# Patient Record
Sex: Female | Born: 1975 | Race: Black or African American | Hispanic: No | Smoking: Former smoker
Health system: Southern US, Community
[De-identification: ages and names within clinical notes are randomized; demographics above are authoritative.]

## PROBLEM LIST (undated history)

## (undated) DIAGNOSIS — T7840XA Allergy, unspecified, initial encounter: Secondary | ICD-10-CM

## (undated) DIAGNOSIS — E119 Type 2 diabetes mellitus without complications: Secondary | ICD-10-CM

## (undated) DIAGNOSIS — K219 Gastro-esophageal reflux disease without esophagitis: Secondary | ICD-10-CM

## (undated) DIAGNOSIS — F419 Anxiety disorder, unspecified: Secondary | ICD-10-CM

## (undated) DIAGNOSIS — F329 Major depressive disorder, single episode, unspecified: Secondary | ICD-10-CM

## (undated) DIAGNOSIS — F32A Depression, unspecified: Secondary | ICD-10-CM

## (undated) DIAGNOSIS — I10 Essential (primary) hypertension: Secondary | ICD-10-CM

## (undated) DIAGNOSIS — D649 Anemia, unspecified: Secondary | ICD-10-CM

## (undated) HISTORY — DX: Allergy, unspecified, initial encounter: T78.40XA

## (undated) HISTORY — DX: Major depressive disorder, single episode, unspecified: F32.9

## (undated) HISTORY — PX: CHOLECYSTECTOMY: SHX55

## (undated) HISTORY — DX: Depression, unspecified: F32.A

## (undated) HISTORY — DX: Anemia, unspecified: D64.9

## (undated) HISTORY — DX: Anxiety disorder, unspecified: F41.9

## (undated) HISTORY — PX: TUBAL LIGATION: SHX77

## (undated) HISTORY — DX: Gastro-esophageal reflux disease without esophagitis: K21.9

---

## 1998-07-18 ENCOUNTER — Emergency Department (HOSPITAL_COMMUNITY): Admission: EM | Admit: 1998-07-18 | Discharge: 1998-07-18 | Payer: Self-pay | Admitting: Emergency Medicine

## 1999-06-03 ENCOUNTER — Emergency Department (HOSPITAL_COMMUNITY): Admission: EM | Admit: 1999-06-03 | Discharge: 1999-06-03 | Payer: Self-pay | Admitting: Emergency Medicine

## 1999-12-25 ENCOUNTER — Emergency Department (HOSPITAL_COMMUNITY): Admission: EM | Admit: 1999-12-25 | Discharge: 1999-12-25 | Payer: Self-pay | Admitting: Emergency Medicine

## 1999-12-25 ENCOUNTER — Encounter: Payer: Self-pay | Admitting: Emergency Medicine

## 2000-05-20 ENCOUNTER — Inpatient Hospital Stay (HOSPITAL_COMMUNITY): Admission: AD | Admit: 2000-05-20 | Discharge: 2000-05-20 | Payer: Self-pay | Admitting: *Deleted

## 2000-05-20 ENCOUNTER — Encounter: Payer: Self-pay | Admitting: *Deleted

## 2000-05-20 ENCOUNTER — Inpatient Hospital Stay (HOSPITAL_COMMUNITY): Admission: AD | Admit: 2000-05-20 | Discharge: 2000-05-20 | Payer: Self-pay | Admitting: Obstetrics

## 2000-05-22 ENCOUNTER — Inpatient Hospital Stay (HOSPITAL_COMMUNITY): Admission: AD | Admit: 2000-05-22 | Discharge: 2000-05-22 | Payer: Self-pay | Admitting: *Deleted

## 2000-05-23 ENCOUNTER — Inpatient Hospital Stay (HOSPITAL_COMMUNITY): Admission: AD | Admit: 2000-05-23 | Discharge: 2000-05-23 | Payer: Self-pay | Admitting: Obstetrics

## 2000-05-23 ENCOUNTER — Encounter: Payer: Self-pay | Admitting: Obstetrics

## 2000-08-11 ENCOUNTER — Encounter: Payer: Self-pay | Admitting: *Deleted

## 2000-08-11 ENCOUNTER — Emergency Department (HOSPITAL_COMMUNITY): Admission: EM | Admit: 2000-08-11 | Discharge: 2000-08-11 | Payer: Self-pay | Admitting: Emergency Medicine

## 2001-03-22 ENCOUNTER — Ambulatory Visit (HOSPITAL_COMMUNITY): Admission: RE | Admit: 2001-03-22 | Discharge: 2001-03-22 | Payer: Self-pay | Admitting: Obstetrics

## 2001-05-31 ENCOUNTER — Inpatient Hospital Stay (HOSPITAL_COMMUNITY): Admission: RE | Admit: 2001-05-31 | Discharge: 2001-05-31 | Payer: Self-pay | Admitting: *Deleted

## 2001-08-24 ENCOUNTER — Inpatient Hospital Stay (HOSPITAL_COMMUNITY): Admission: AD | Admit: 2001-08-24 | Discharge: 2001-08-26 | Payer: Self-pay | Admitting: *Deleted

## 2002-02-25 ENCOUNTER — Emergency Department (HOSPITAL_COMMUNITY): Admission: EM | Admit: 2002-02-25 | Discharge: 2002-02-25 | Payer: Self-pay | Admitting: Emergency Medicine

## 2002-02-25 ENCOUNTER — Encounter: Payer: Self-pay | Admitting: Emergency Medicine

## 2002-05-09 ENCOUNTER — Other Ambulatory Visit: Admission: RE | Admit: 2002-05-09 | Discharge: 2002-05-09 | Payer: Self-pay | Admitting: *Deleted

## 2002-05-10 ENCOUNTER — Encounter: Payer: Self-pay | Admitting: *Deleted

## 2002-05-10 ENCOUNTER — Ambulatory Visit (HOSPITAL_COMMUNITY): Admission: RE | Admit: 2002-05-10 | Discharge: 2002-05-10 | Payer: Self-pay | Admitting: *Deleted

## 2002-10-30 ENCOUNTER — Inpatient Hospital Stay (HOSPITAL_COMMUNITY): Admission: AD | Admit: 2002-10-30 | Discharge: 2002-11-01 | Payer: Self-pay | Admitting: *Deleted

## 2003-01-11 ENCOUNTER — Inpatient Hospital Stay (HOSPITAL_COMMUNITY): Admission: AD | Admit: 2003-01-11 | Discharge: 2003-01-11 | Payer: Self-pay | Admitting: Obstetrics and Gynecology

## 2004-01-10 ENCOUNTER — Other Ambulatory Visit: Admission: RE | Admit: 2004-01-10 | Discharge: 2004-01-10 | Payer: Self-pay | Admitting: Obstetrics and Gynecology

## 2004-10-19 ENCOUNTER — Emergency Department (HOSPITAL_COMMUNITY): Admission: EM | Admit: 2004-10-19 | Discharge: 2004-10-19 | Payer: Self-pay | Admitting: Family Medicine

## 2004-12-23 ENCOUNTER — Emergency Department (HOSPITAL_COMMUNITY): Admission: EM | Admit: 2004-12-23 | Discharge: 2004-12-23 | Payer: Self-pay | Admitting: Emergency Medicine

## 2005-03-16 ENCOUNTER — Emergency Department (HOSPITAL_COMMUNITY): Admission: EM | Admit: 2005-03-16 | Discharge: 2005-03-16 | Payer: Self-pay | Admitting: Emergency Medicine

## 2006-04-24 ENCOUNTER — Emergency Department (HOSPITAL_COMMUNITY): Admission: EM | Admit: 2006-04-24 | Discharge: 2006-04-24 | Payer: Self-pay | Admitting: Family Medicine

## 2006-07-08 ENCOUNTER — Emergency Department (HOSPITAL_COMMUNITY): Admission: EM | Admit: 2006-07-08 | Discharge: 2006-07-08 | Payer: Self-pay | Admitting: Family Medicine

## 2006-07-20 ENCOUNTER — Other Ambulatory Visit: Admission: RE | Admit: 2006-07-20 | Discharge: 2006-07-20 | Payer: Self-pay | Admitting: Obstetrics and Gynecology

## 2006-09-07 ENCOUNTER — Emergency Department (HOSPITAL_COMMUNITY): Admission: EM | Admit: 2006-09-07 | Discharge: 2006-09-07 | Payer: Self-pay | Admitting: Emergency Medicine

## 2006-11-16 ENCOUNTER — Ambulatory Visit: Payer: Self-pay | Admitting: Obstetrics & Gynecology

## 2006-11-16 ENCOUNTER — Inpatient Hospital Stay (HOSPITAL_COMMUNITY): Admission: AD | Admit: 2006-11-16 | Discharge: 2006-11-16 | Payer: Self-pay | Admitting: Obstetrics and Gynecology

## 2007-01-30 ENCOUNTER — Inpatient Hospital Stay (HOSPITAL_COMMUNITY): Admission: AD | Admit: 2007-01-30 | Discharge: 2007-01-30 | Payer: Self-pay | Admitting: Obstetrics & Gynecology

## 2007-02-03 ENCOUNTER — Encounter (INDEPENDENT_AMBULATORY_CARE_PROVIDER_SITE_OTHER): Payer: Self-pay | Admitting: Obstetrics and Gynecology

## 2007-02-03 ENCOUNTER — Ambulatory Visit (HOSPITAL_COMMUNITY): Admission: AD | Admit: 2007-02-03 | Discharge: 2007-02-03 | Payer: Self-pay | Admitting: Obstetrics and Gynecology

## 2010-07-23 NOTE — Op Note (Signed)
NAME:  Kylie Robbins, Kylie Robbins NO.:  [id number]   MEDICAL RECORD NO.:  [id number]          PATIENT TYPE:  MAT   LOCATION:  MATC                          FACILITY:  [facility]   PHYSICIAN:  Huel Cote, M.D. DATE OF BIRTH:  12-31-1975   DATE OF PROCEDURE:  DATE OF DISCHARGE:                               OPERATIVE REPORT   PREOPERATIVE DIAGNOSIS:  Incomplete abortion at [redacted] weeks gestational age  with heavy bleeding.   POSTOPERATIVE DIAGNOSIS:  Incomplete abortion at [redacted] weeks gestational  age with heavy bleeding.   PROCEDURE:  Dilation and evacuation.   SURGEON:  Dr. Huel Cote.   ANESTHESIA:  Spinal.   FINDINGS:  The uterus was 10 weeks' size with moderate products of  conception obtained.  There were some tissue extruding through the  cervix.  This was sent to pathology.  Estimated blood loss 200 mL.  Straight cath 50 mL prior to procedure.  IV fluids 2100 mL LR.   PROCEDURE:  The patient was taken to operating room where spinal  anesthesia was obtained without difficulty.  She was then prepped and  draped in the normal sterile fashion in dorsal lithotomy position.  A  speculum was placed in the vagina after several large blood clots were  removed from the vagina and the cervix identified and noted to have some  products of conception extruding through it.  These were removed with  ring forceps and handed off to pathology.  The cervix was also noted to  be approximate 1 cm to 2 cm dilated and at this point a uterine sound  was introduced with the uterus sounding to approximately 9 cm to 10 mm.  The 9 mm suction curette was therefore obtained and easily introduced  into the uterine fundus.  In several passes large to moderate amount of  products of conception were obtained and this was continued until there  was no further tissue forthcoming.  The suction was then discontinued  and a sharp curettage performed in all quadrants.  Small amount of  tissue was  noted, however, no other large amounts of tissue were  identified.  Suction curette was then introduced 2 more times and no  significant tissue was removed.  At this point this was discontinued and  the uterus and cervix inspected.  There was really minimal bleeding  noted.  The tenaculum was removed from the anterior lip and the  tenaculum site treated with silver nitrate for no bleeding.  Sponge, lap  and needle counts were correct x2 and the patient was taken to the  recovery room in stable condition.  Given the amount of bleeding and  drop in her hemoglobin over the past several days she will have  hemoglobin checked in the recovery room and if she remains asymptomatic  with ambulation will be allowed to discharge home      Huel Cote, M.D.  Electronically Signed     KR/MEDQ  D:  02/03/2007  T:  02/04/2007  Job:  [id number]

## 2010-12-17 LAB — POCT PREGNANCY, URINE
Operator id: 28886
Preg Test, Ur: POSITIVE

## 2010-12-17 LAB — CBC
Hemoglobin: 12.5
MCHC: 34.1
MCV: 88.1
Platelets: 251
Platelets: 291

## 2010-12-17 LAB — WET PREP, GENITAL
Trich, Wet Prep: NONE SEEN
Yeast Wet Prep HPF POC: NONE SEEN

## 2010-12-17 LAB — URINE MICROSCOPIC-ADD ON

## 2010-12-17 LAB — GC/CHLAMYDIA PROBE AMP, GENITAL: Chlamydia, DNA Probe: NEGATIVE

## 2010-12-17 LAB — URINALYSIS, ROUTINE W REFLEX MICROSCOPIC
Bilirubin Urine: NEGATIVE
Glucose, UA: NEGATIVE
Leukocytes, UA: NEGATIVE
Specific Gravity, Urine: >1.03 — ABNORMAL HIGH

## 2010-12-17 LAB — ABO/RH: ABO/RH(D): A POS

## 2010-12-17 LAB — HCG, QUANTITATIVE, PREGNANCY: hCG, Beta Chain, Quant, S: 5621 — ABNORMAL HIGH

## 2010-12-17 LAB — HEMOGLOBIN AND HEMATOCRIT, BLOOD
HCT: 23.5 — ABNORMAL LOW
Hemoglobin: 8 — ABNORMAL LOW

## 2010-12-20 LAB — WET PREP, GENITAL
Trich, Wet Prep: NONE SEEN
Yeast Wet Prep HPF POC: NONE SEEN

## 2010-12-20 LAB — CBC
MCHC: 34
RBC: 4.41
WBC: 6.2

## 2010-12-20 LAB — URINALYSIS, ROUTINE W REFLEX MICROSCOPIC: Specific Gravity, Urine: 1.02

## 2010-12-20 LAB — URINE MICROSCOPIC-ADD ON

## 2010-12-20 LAB — GC/CHLAMYDIA PROBE AMP, GENITAL: Chlamydia, DNA Probe: NEGATIVE

## 2010-12-20 LAB — POCT PREGNANCY, URINE
Operator id: 220991
Preg Test, Ur: NEGATIVE

## 2015-04-30 ENCOUNTER — Emergency Department (HOSPITAL_COMMUNITY)
Admission: EM | Admit: 2015-04-30 | Discharge: 2015-04-30 | Disposition: A | Discharge status: Home / Self Care | Payer: Self-pay | Attending: Emergency Medicine | Admitting: Emergency Medicine

## 2015-04-30 ENCOUNTER — Encounter (HOSPITAL_COMMUNITY): Payer: Self-pay | Admitting: Emergency Medicine

## 2015-04-30 DIAGNOSIS — H6692 Otitis media, unspecified, left ear: Secondary | ICD-10-CM | POA: Insufficient documentation

## 2015-04-30 DIAGNOSIS — J040 Acute laryngitis: Secondary | ICD-10-CM | POA: Insufficient documentation

## 2015-04-30 MED ORDER — AMOXICILLIN-POT CLAVULANATE 875-125 MG PO TABS: 1.0000 | ORAL_TABLET | Freq: Two times a day (BID) | ORAL | Status: DC

## 2015-04-30 NOTE — ED Provider Notes (Signed)
CSN: [id number]     Arrival date & time 04/30/15  1235 History  By signing my name below, I, Kylie Robbins, attest that this documentation has been prepared under the direction and in the presence of [facility], PA-C. Electronically Signed: Ronney Robbins, ED Scribe. 04/30/2015. 4:42 PM.    Chief Complaint  Patient presents with  . Cough   The history is provided by the patient. No language interpreter was used.    HPI Comments: Kylie Robbins is a 40 y.o. female with no pertinent PMHx, who presents to the Emergency Department complaining of a gradual-onset, constant, moderate, dry cough that began 2 days ago. She also notes associated sinus pressure, bilateral ear pressure, subjective fever and chills (resolved), and voice hoarseness. She denies any excessive yelling. She denies any chest pain.    History reviewed. No pertinent past medical history. Past Surgical History  Procedure Laterality Date  . Cholecystectomy     No family history on file. Social History  Substance Use Topics  . Smoking status: Never Smoker   . Smokeless tobacco: None  . Alcohol Use: No   OB History    No data available     Review of Systems  Constitutional: Positive for fever (subjective, resolved) and chills (resolved).  HENT: Positive for congestion, ear pain (pressure), sinus pressure and voice change (voice hoarseness).   Respiratory: Positive for cough.   Cardiovascular: Negative for chest pain.    Allergies  Review of patient's allergies indicates no known allergies.  Home Medications   Prior to Admission medications   Medication Sig Start Date End Date Taking? Authorizing Provider  amoxicillin-clavulanate (AUGMENTIN) 875-125 MG tablet Take 1 tablet by mouth every 12 (twelve) hours. 04/30/15   Jencarlo Bonadonna Pilcher Arneshia Ade, PA-C   BP 141/75 mmHg  Pulse 73  Temp(Src) 98.2 F (36.8 C) (Oral)  Resp 14  Ht  (1.676 m)  Wt 154.223 kg  BMI 54.90 kg/m2  SpO2 99%  LMP 04/13/2015 Physical Exam   Constitutional: She is oriented to person, place, and time. She appears well-developed and well-nourished. No distress.  HENT:  Head: Normocephalic and atraumatic.  Right Ear: Tympanic membrane, external ear and ear canal normal.  Left Ear: External ear normal. Tympanic membrane is erythematous.  + nasal congestion OP with erythema, no exudates, no hypertrophy  Neck: Normal range of motion. Neck supple. No tracheal deviation present.  Cardiovascular: Normal rate and normal heart sounds.   No murmur heard. Pulmonary/Chest: Effort normal and breath sounds normal. No respiratory distress. She has no wheezes. She has no rales.  Abdominal: Soft. She exhibits no distension. There is no tenderness.  Musculoskeletal: Normal range of motion.  Neurological: She is alert and oriented to person, place, and time.  Skin: Skin is warm and dry. No rash noted.  Psychiatric: She has a normal mood and affect. Her behavior is normal.  Nursing note and vitals reviewed.   ED Course  Procedures (including critical care time)  DIAGNOSTIC STUDIES: Oxygen Saturation is 99% on RA, normal by my interpretation.    COORDINATION OF CARE: 1:52 PM - Discussed treatment plan with pt at bedside which includes Rx antibiotics for ear infection, OTC Flonase for nasal congestion. Pt verbalized understanding and agreed to plan.   MDM   Final diagnoses:  Laryngitis  Acute left otitis media, recurrence not specified, unspecified otitis media type   Patient presents with cough, bilateral ear pain/pressure L>R exam consistent with acute otitis media. No concern for acute mastoiditis, meningitis.  No antibiotic use in the last month.  Patient discharged home with Augmentin. I have discussed reasons to return immediately to the ER.  Patient expresses understanding and agrees with plan.  I personally performed the services described in this documentation, which was scribed in my presence. The recorded information has been  reviewed and is accurate.    [facility] Wrenley Sayed, PA-C 04/30/15 1642  Lorre Nick, MD 05/01/15 [phone]

## 2015-04-30 NOTE — ED Notes (Signed)
Pt from home for eval of cough, inner ear pressure to bilateral ears since Saturday. Pt also reports pressure to sinuses, denies any cp or sob. No wheezing or distress noted in triag.e

## 2015-04-30 NOTE — Discharge Instructions (Signed)
1. Medications: flonase for nasal congestion, Please take all of your antibiotics until finished!  2. Treatment: rest, drink plenty of fluids, take tylenol or ibuprofen for fever control if needed.  3. Follow Up: Please follow up with your primary doctor in 3 days for discussion of your diagnoses and further evaluation after today's visit; if you do not have a primary care doctor use the resource guide provided to find one; Return to the ER for high fevers, difficulty breathing or other concerning symptoms   Emergency Department Resource Guide  1) Find a Doctor and Pay Out of Pocket Although you won't have to find out who is covered by your insurance plan, it is a good idea to ask around and get recommendations. You will then need to call the office and see if the doctor you have chosen will accept you as a new patient and what types of options they offer for patients who are self-pay. Some doctors offer discounts or will set up payment plans for their patients who do not have insurance, but you will need to ask so you aren't surprised when you get to your appointment.  2) Contact Your Local Health Department Not all health departments have doctors that can see patients for sick visits, but many do, so it is worth a call to see if yours does. If you don't know where your local health department is, you can check in your phone book. The CDC also has a tool to help you locate your state's health department, and many state websites also have listings of all of their local health departments.  3) Find a Walk-in Clinic If your illness is not likely to be very severe or complicated, you may want to try a walk in clinic. These are popping up all over the country in pharmacies, drugstores, and shopping centers. They're usually staffed by nurse practitioners or physician assistants that have been trained to treat common illnesses and complaints. They're usually fairly quick and inexpensive. However, if you have  serious medical issues or chronic medical problems, these are probably not your best option.  No Primary Care Doctor: - Call Health Connect at  [phone]: they can help you locate a primary care doctor that  accepts your insurance, provides certain services, etc. - Physician Referral Service: [phone]  Chronic Pain Problems: Organization         Address  Phone   Notes  [facility]  [phone] Patients need to be referred by their primary care doctor.   Medication Assistance: Organization         Address  Phone   Notes  [facility] [address] [place] [place], [place] [address] [phone] --Must be a resident of [facility] -- Must have NO insurance coverage whatsoever (no Medicaid/ Medicare, etc.) -- The pt. MUST have a primary care doctor that directs their care regularly and follows them in the community   [facility]  [phone]   [employer]  [phone]    Agencies that provide inexpensive medical care: Organization         Address  Phone   Notes  [facility]  [phone]   [facility]    [phone]   [facility] [address] [place], [place] [address] [phone]   [facility] [address], [place] [phone]   [facility] Parenthood    [phone]  [facility]    [phone]   [facility]  [address], [place] Phone:  [phone], Fax:  [phone] Hours of Operation:  9 am - 6 pm, M-F.  Also accepts Medicaid/Medicare and self-pay.  [facility]  [address], [place] Phone: [phone], Fax: [phone]. Hours of Operation:  8:30 am - 5:30 pm, M-F.  Also accepts Medicaid and self-pay.  [facility] [place] [address], [place] Phone: [phone]   [facility] [address] Natasha Bence [place], [place] [phone], Ext. [phone] Mondays & Thursdays: 7-9 AM.  First 15 patients are seen on a first come, first serve basis.    Medicaid-accepting [facility] Providers:  Organization         Address  Phone   Notes  [facility] [address], [place] [phone] Also accepts self-pay patients.  [facility] [address] [place] [address] Laurell Josephs [place], [place]  [phone]   [facility] [address], [place] [phone]   [facility] [address] [place] [phone]   Renaye Rakers [address], [place]   [phone] Only accepts [place] [facility] [place] patients after they have their name applied to their card.   Self-Pay (no insurance) in [facility]:  Organization         Address  Phone   Notes  Sickle Cell Patients, [facility] [address] [place], [place] [phone]   [facility] [address] [place], [place] [phone]   [facility] [place]  [address], [place] [phone]   [facility]/Dr. Osei-Bonsu  [address] [place] or [address], [place] [phone] Phone number for both [place] and [place] [facility] [place] locations is the same.  [facility] [address], [place] [phone]   [facility] [address] [place] or [address] [phone]   [facility] [address], [place] [phone], phone; [phone], fax Sees patients 1st and 3rd Saturday of every month.  Must not qualify for public or private insurance (i.e. Medicaid, Medicare, [place] Health Choice, Veterans' Benefits)  Household income should be no more than 200% of the poverty level The clinic cannot treat you if you are pregnant or think you are pregnant   Sexually transmitted diseases are not treated at the clinic.

## 2015-04-30 NOTE — ED Notes (Signed)
Declined W/C at D/C and was escorted to lobby by RN. 

## 2015-07-15 ENCOUNTER — Encounter (HOSPITAL_COMMUNITY): Payer: Self-pay | Admitting: *Deleted

## 2015-07-15 ENCOUNTER — Emergency Department (HOSPITAL_COMMUNITY)
Admission: EM | Admit: 2015-07-15 | Discharge: 2015-07-15 | Disposition: A | Discharge status: Home / Self Care | Payer: Medicaid Other | Attending: Emergency Medicine | Admitting: Emergency Medicine

## 2015-07-15 DIAGNOSIS — K0381 Cracked tooth: Secondary | ICD-10-CM | POA: Diagnosis not present

## 2015-07-15 DIAGNOSIS — G478 Other sleep disorders: Secondary | ICD-10-CM | POA: Insufficient documentation

## 2015-07-15 DIAGNOSIS — K029 Dental caries, unspecified: Secondary | ICD-10-CM | POA: Diagnosis not present

## 2015-07-15 DIAGNOSIS — K002 Abnormalities of size and form of teeth: Secondary | ICD-10-CM | POA: Insufficient documentation

## 2015-07-15 DIAGNOSIS — Z792 Long term (current) use of antibiotics: Secondary | ICD-10-CM | POA: Insufficient documentation

## 2015-07-15 DIAGNOSIS — K0889 Other specified disorders of teeth and supporting structures: Secondary | ICD-10-CM

## 2015-07-15 MED ORDER — PENICILLIN V POTASSIUM 500 MG PO TABS: 500.0000 mg | ORAL_TABLET | Freq: Four times a day (QID) | ORAL | Status: DC

## 2015-07-15 NOTE — ED Notes (Signed)
Declined W/C at D/C and was escorted to lobby by RN. 

## 2015-07-15 NOTE — ED Notes (Signed)
PT reports a broken tooth on RFT lower side. Pt reports increased pain and swelling.

## 2015-07-15 NOTE — Discharge Instructions (Signed)
Community Resource Guide Dental °The United Way’s “211” is a great source of information about community services available.  Access by dialing 2-1-1 from anywhere in [place], or by website -  www.nc211.org.  ° °Other Local Resources (Updated 03/2015) ° °Dental  Care °  °Services ° °  °Phone Number and Address  °Cost  °[facility] For children 0 - 40 years of age:  °• Cleaning °• Tooth brushing/flossing instruction °• Sealants, fillings, crowns °• Extractions °• Emergency treatment  [phone] °[address] °[place], [place] [address] Charges based on family income.  Medicaid and some insurance plans accepted.   °  °[facility] [place] • Cleaning °• Sealants, fillings, crowns °• Extractions °• Emergency treatment [phone] °[address] °[place], [place] ° Pregnant women 18 years of age or older with a Medicaid card  °[facility] [place] • Cleaning °• Sealants, fillings, crowns °• Extractions °• Emergency treatment [phone] °[address] °[place], [place] Pregnant women 18 years of age or older with a Medicaid card  °[facility] For children 0 - 40 years of age:  °• Cleaning °• Tooth brushing/flossing instruction °• Sealants, fillings, crowns °• Extractions °• Emergency treatment °Limited orthodontic services for patients with Medicaid [phone] °[address] °, [place] [address] Medicaid and [place] Health Choice cover for children up to age 40 and pregnant women.  Parents of children up to age 40 without Medicaid pay a reduced fee at time of service.  °[facility] [place] For children 0 - 40 years of age:  °• Cleaning °• Tooth brushing/flossing instruction °• Sealants, fillings, crowns °• Extractions °• Emergency treatment °Limited orthodontic services for patients with Medicaid  [phone] °[address] °[place], [place].  Medicaid and [place] Health Choice cover for children up to age 40 and pregnant women.  Parents of children up to age 40 without Medicaid pay a reduced fee.  °[facility] • Cleaning °• Sealants, fillings, crowns °• Extractions ° °Hours: Tuesdays and Thursdays, 4:15 - 8 pm [phone] °[address] °[place], [place] [address] Services free of charge to [place] residents ages 18-64 who do not have health insurance, Medicare, Medicaid, or VA benefits and fall within federal poverty guidelines  °[facility] ° ° ° Provides dental care in addition to primary medical care, nutritional counseling, and pharmacy: °• Cleaning °• Sealants, fillings, crowns °• Extractions ° ° ° ° ° ° ° ° ° ° ° ° ° ° ° ° ° [phone] °[facility], [address] °[place], [place] ° °[phone] °[facility], [address] [place], [place] ° °[phone] °[facility] °[place], [place] ° °[phone] °[facility], [address] °, [place] ° °[phone] °[facility] °[address] °[place], [place] Accepts Medicaid, Medicare, most insurance.  Also provides services available to all with fees adjusted based on ability to pay.    °[facility] • Cleaning °• Tooth brushing/flossing instruction °• Sealants, fillings, crowns °• Extractions °• Emergency treatment °Hours: Tuesdays, Thursdays, and Fridays from 8 am to 5 pm by appointment only. [phone] °[address] °[place], [place] [address] [place] residents with Medicaid (depending on eligibility) and children with  Health Choice - call for more information.  °  [facility] • Extractions only ° °Hours: 2nd and 4th Thursday of each month from 6:30 am - 9 am.   [phone] ext. [phone] °[address] °[place], [place] [address] Ages 18 and older only.  Patients are seen on a first come, first served basis.  °[facility] • Cleanings °• Fillings °• Extractions °• Orthodontics °• Endodontics °• Implants/Crowns/Bridges °• Complete and partial dentures [phone] °[place], [place] Patients must complete an application for services.  There is often a waiting list.   ° °

## 2015-07-15 NOTE — ED Provider Notes (Signed)
CSN: [id number]     Arrival date & time 07/15/15  1809 History   First MD Initiated Contact with Patient 07/15/15 1816     Chief Complaint  Patient presents with  . Dental Injury     (Consider location/radiation/quality/duration/timing/severity/associated sxs/prior Treatment) HPI Comments: Patient presents to the emergency department with chief complaint of dental pain. She states that she noticed a crack in her right lower rear molar about a week ago. States that she has had gradually worsening pain. She has been taking ibuprofen with good relief. She does not have a dentist, and asks for resources. She denies any fevers or chills. She states that she has had some associated increasing swelling. There are no other associated symptoms. There are no modifying factors.  The history is provided by the patient. No language interpreter was used.    History reviewed. No pertinent past medical history. Past Surgical History  Procedure Laterality Date  . Cholecystectomy     History reviewed. No pertinent family history. Social History  Substance Use Topics  . Smoking status: Never Smoker   . Smokeless tobacco: None  . Alcohol Use: No   OB History    No data available     Review of Systems  Constitutional: Negative for fever and chills.  HENT: Positive for dental problem. Negative for drooling.   Neurological: Negative for speech difficulty.  Psychiatric/Behavioral: Positive for sleep disturbance.      Allergies  Other  Home Medications   Prior to Admission medications   Medication Sig Start Date End Date Taking? Authorizing Provider  amoxicillin-clavulanate (AUGMENTIN) 875-125 MG tablet Take 1 tablet by mouth every 12 (twelve) hours. 04/30/15   Jaime Pilcher Ward, PA-C   BP 153/106 mmHg  Pulse 95  Temp(Src) 99.1 F (37.3 C) (Oral)  Resp 20  SpO2 100% Physical Exam Physical Exam  Constitutional: Pt appears well-developed and well-nourished.  HENT:  Head: Normocephalic.   Right Ear: Tympanic membrane, external ear and ear canal normal.  Left Ear: Tympanic membrane, external ear and ear canal normal.  Nose: Nose normal. Right sinus exhibits no maxillary sinus tenderness and no frontal sinus tenderness. Left sinus exhibits no maxillary sinus tenderness and no frontal sinus tenderness.  Mouth/Throat: Uvula is midline, oropharynx is clear and moist and mucous membranes are normal. No oral lesions. Abnormal dentition. Dental caries present. No uvula swelling or lacerations. No oropharyngeal exudate, posterior oropharyngeal edema, posterior oropharyngeal erythema or tonsillar abscesses.  No gingival swelling, fluctuance or induration No gross abscess  No sublingual edema, tenderness, or sign of Ludwig's angina Eyes: Conjunctivae are normal. Pupils are equal, round, and reactive to light. Right eye exhibits no discharge. Left eye exhibits no discharge.  Neck: Normal range of motion. Neck supple.  No stridor Handling secretions without difficulty No nuchal rigidity No cervical lymphadenopathy   Cardiovascular: Normal rate, regular rhythm and normal heart sounds.   Pulmonary/Chest: Effort normal. No respiratory distress.  Equal chest rise  Abdominal: Soft. Bowel sounds are normal. Pt exhibits no distension. There is no tenderness.  Lymphadenopathy:    Pt has no cervical adenopathy.  Neurological: Pt is alert.  Skin: Skin is warm and dry.  Psychiatric: Pt has a normal mood and affect.  Nursing note and vitals reviewed.   ED Course  Procedures (including critical care time)   MDM   Final diagnoses:  Pain, dental    Patient with toothache.  No gross abscess.  Exam unconcerning for Ludwig's angina or spread of infection.  Will  treat with penicillin and OTC pain medicine.  Urged patient to follow-up with dentist.       Roxy Horsemanobert Natayah Warmack, PA-C 07/15/15 1825  Margarita Grizzleanielle Ray, MD 07/18/15 [phone]

## 2016-06-13 ENCOUNTER — Encounter (HOSPITAL_COMMUNITY): Payer: Self-pay | Admitting: *Deleted

## 2016-06-13 ENCOUNTER — Emergency Department (HOSPITAL_COMMUNITY): Payer: 59

## 2016-06-13 ENCOUNTER — Emergency Department (HOSPITAL_COMMUNITY)
Admission: EM | Admit: 2016-06-13 | Discharge: 2016-06-13 | Disposition: A | Discharge status: Home / Self Care | Payer: 59 | Attending: Emergency Medicine | Admitting: Emergency Medicine

## 2016-06-13 DIAGNOSIS — R0602 Shortness of breath: Secondary | ICD-10-CM | POA: Diagnosis present

## 2016-06-13 DIAGNOSIS — R0789 Other chest pain: Secondary | ICD-10-CM | POA: Insufficient documentation

## 2016-06-13 DIAGNOSIS — F17210 Nicotine dependence, cigarettes, uncomplicated: Secondary | ICD-10-CM | POA: Diagnosis not present

## 2016-06-13 LAB — I-STAT TROPONIN, ED
TROPONIN I, POC: 0 ng/mL (ref 0.00–0.08)
TROPONIN I, POC: 0 ng/mL (ref 0.00–0.08)

## 2016-06-13 LAB — CBC WITH DIFFERENTIAL/PLATELET
Basophils Absolute: 0 10*3/uL (ref 0.0–0.1)
Basophils Relative: 1 %
Eosinophils Absolute: 0.2 10*3/uL (ref 0.0–0.7)
Eosinophils Relative: 2 %
HEMATOCRIT: 35.4 % — AB (ref 36.0–46.0)
Hemoglobin: 11.1 g/dL — ABNORMAL LOW (ref 12.0–15.0)
LYMPHS ABS: 2.6 10*3/uL (ref 0.7–4.0)
LYMPHS PCT: 33 %
MCH: 24.7 pg — AB (ref 26.0–34.0)
MCHC: 31.4 g/dL (ref 30.0–36.0)
MCV: 78.8 fL (ref 78.0–100.0)
MONO ABS: 0.7 10*3/uL (ref 0.1–1.0)
MONOS PCT: 9 %
NEUTROS ABS: 4.4 10*3/uL (ref 1.7–7.7)
Neutrophils Relative %: 55 %
Platelets: 354 10*3/uL (ref 150–400)
RBC: 4.49 MIL/uL (ref 3.87–5.11)
RDW: 17.3 % — AB (ref 11.5–15.5)
WBC: 7.8 10*3/uL (ref 4.0–10.5)

## 2016-06-13 LAB — COMPREHENSIVE METABOLIC PANEL
ALT: 13 U/L — ABNORMAL LOW (ref 14–54)
ANION GAP: 11 (ref 5–15)
AST: 19 U/L (ref 15–41)
Albumin: 3 g/dL — ABNORMAL LOW (ref 3.5–5.0)
Alkaline Phosphatase: 72 U/L (ref 38–126)
BILIRUBIN TOTAL: 0.2 mg/dL — AB (ref 0.3–1.2)
BUN: 12 mg/dL (ref 6–20)
CALCIUM: 9 mg/dL (ref 8.9–10.3)
CO2: 22 mmol/L (ref 22–32)
Chloride: 105 mmol/L (ref 101–111)
Creatinine, Ser: 0.81 mg/dL (ref 0.44–1.00)
GFR calc Af Amer: >60 mL/min (ref >60)
Glucose, Bld: 115 mg/dL — ABNORMAL HIGH (ref 65–99)
POTASSIUM: 4.2 mmol/L (ref 3.5–5.1)
Sodium: 138 mmol/L (ref 135–145)
TOTAL PROTEIN: 6.4 g/dL — AB (ref 6.5–8.1)

## 2016-06-13 NOTE — ED Notes (Signed)
PT ambulates back to room from restroom without difficulty

## 2016-06-13 NOTE — ED Notes (Signed)
Upon further assessment pt endorses a tightness in her upper chest.

## 2016-06-13 NOTE — ED Triage Notes (Signed)
Pt states she was on her way to work this morning when she became short of breath. Pt states she has no pain, but feels like she is unable to catch her breath. Pt states she has had anxiety attacks in the past, but feels like this is worse.

## 2016-06-13 NOTE — ED Notes (Signed)
Patient transported to X-ray 

## 2016-06-13 NOTE — ED Provider Notes (Signed)
[facility]-EMERGENCY DEPT Provider Note   CSN: [id number] Arrival date & time: 06/13/16  0909     History   Chief Complaint Chief Complaint  Patient presents with  . Shortness of Breath    HPI Kylie Robbins is a 41 y.o. female.  HPI  41 year old female with history of smoking presents with concern for shortness of breath, cold sweats, chest tightness.  Reports the chest tightness comes in waves. It is relieved by stress relief exercises, deep breathing. Denies history of similar symptoms. Tightness is not exertional. Reports she's been under a lot of stress recently. Reports nausea and vomiting last night, however none this morning. Denies use of OCPs, possibility of pregnancy, leg pain or swelling, recent immobilization or surgery. Reports a little more than one month ago she did drive to [place], but no other immobilization since that time. Denies any immediate family history of coronary artery disease, however reports that her grandfather had an MI at around age 59.   History reviewed. No pertinent past medical history.  There are no active problems to display for this patient.   Past Surgical History:  Procedure Laterality Date  . CHOLECYSTECTOMY      OB History    No data available       Home Medications    Prior to Admission medications   Medication Sig Start Date End Date Taking? Authorizing Provider  amoxicillin-clavulanate (AUGMENTIN) 875-125 MG tablet Take 1 tablet by mouth every 12 (twelve) hours. 04/30/15   Chase Picket Ward, PA-C  penicillin v potassium (VEETID) 500 MG tablet Take 1 tablet (500 mg total) by mouth 4 (four) times daily. 07/15/15   Roxy Horseman, PA-C    Family History History reviewed. No pertinent family history.  Social History Social History  Substance Use Topics  . Smoking status: Current Every Day Smoker    Packs/day: 1.00    Types: Cigarettes  . Smokeless tobacco: Never Used  . Alcohol use Yes     Allergies    Other   Review of Systems Review of Systems  Constitutional: Positive for diaphoresis. Negative for fever.  HENT: Negative for sore throat.   Eyes: Negative for visual disturbance.  Respiratory: Positive for shortness of breath. Negative for cough.   Cardiovascular: Positive for chest pain.  Gastrointestinal: Positive for nausea and vomiting. Negative for abdominal pain.  Genitourinary: Negative for difficulty urinating.  Musculoskeletal: Negative for back pain and neck pain.  Skin: Negative for rash.  Neurological: Positive for light-headedness. Negative for syncope and headaches.     Physical Exam Updated Vital Signs BP 126/79 (BP Location: Left Arm)   Pulse (!) 54   Temp 98.1 F (36.7 C) (Oral)   Resp 14   Ht  (1.676 m)   Wt (!) 304 lb (137.9 kg)   LMP 05/23/2016   SpO2 100%   BMI 49.07 kg/m   Physical Exam  Constitutional: She is oriented to person, place, and time. She appears well-developed and well-nourished. No distress.  HENT:  Head: Normocephalic and atraumatic.  Eyes: Conjunctivae and EOM are normal.  Neck: Normal range of motion.  Cardiovascular: Normal rate, regular rhythm, normal heart sounds and intact distal pulses.  Exam reveals no gallop and no friction rub.   No murmur heard. Pulmonary/Chest: Effort normal and breath sounds normal. No respiratory distress. She has no wheezes. She has no rales.  Abdominal: Soft. She exhibits no distension. There is no tenderness. There is no guarding.  Musculoskeletal: She exhibits no edema or  tenderness.  Neurological: She is alert and oriented to person, place, and time.  Skin: Skin is warm and dry. No rash noted. She is not diaphoretic. No erythema.  Nursing note and vitals reviewed.    ED Treatments / Results  Labs (all labs ordered are listed, but only abnormal results are displayed) Labs Reviewed  CBC WITH DIFFERENTIAL/PLATELET - Abnormal; Notable for the following:       Result Value   Hemoglobin  11.1 (*)    HCT 35.4 (*)    MCH 24.7 (*)    RDW 17.3 (*)    All other components within normal limits  COMPREHENSIVE METABOLIC PANEL - Abnormal; Notable for the following:    Glucose, Bld 115 (*)    Total Protein 6.4 (*)    Albumin 3.0 (*)    ALT 13 (*)    Total Bilirubin 0.2 (*)    All other components within normal limits  I-STAT TROPOININ, ED  I-STAT TROPOININ, ED    EKG  EKG Interpretation  Date/Time:  Friday June 13 2016 09:16:51 EDT Ventricular Rate:  96 PR Interval:    QRS Duration: 89 QT Interval:  350 QTC Calculation: 443 R Axis:   65 Text Interpretation:  Sinus rhythm Baseline wander in lead(s) V2 No significant change since last tracing Confirmed by [facility] MD, Amaani Guilbault ([id number]) on 06/13/2016 9:45:57 AM       Radiology Dg Chest 2 View  Result Date: 06/13/2016 CLINICAL DATA:  Shortness of breath.  Wheezing. EXAM: CHEST  2 VIEW COMPARISON:  09/07/2006 . FINDINGS: Stable cardiomegaly with normal pulmonary vascularity. No focal infiltrate. No pleural effusion or pneumothorax. No acute bony abnormality. IMPRESSION: 1. Stable cardiomegaly. 2. No focal pulmonary infiltrate. Electronically Signed   By: Maisie Fus  Register   On: 06/13/2016 10:16    Procedures Procedures (including critical care time)  Medications Ordered in ED Medications - No data to display   Initial Impression / Assessment and Plan / ED Course  I have reviewed the triage vital signs and the nursing notes.  Pertinent labs & imaging results that were available during my care of the patient were reviewed by me and considered in my medical decision making (see chart for details).    41 year old female with history of smoking presents with concern for shortness of breath and chest tightness.  Differential diagnosis for chest pain includes pulmonary embolus, dissection, pneumothorax, pneumonia, ACS, myocarditis, pericarditis.  EKG was done and evaluate by me and showed no acute ST changes and no signs of  pericarditis. Chest x-ray was done and evaluated by me and radiology and showed  no sign of pneumonia or pneumothorax. Patient is PERC negative and low risk Wells and have low suspicion for PE.  Patient is low risk HEART score and had delta troponins which were both negative. Given this evaluation, history and physical have low suspicion for pulmonary embolus, pneumonia, ACS, myocarditis, pericarditis, dissection.  Recommend close PCP follow up within one week, stress reduction, return for new or worsening symptoms. Patient discharged in stable condition with understanding of reasons to return.    Final Clinical Impressions(s) / ED Diagnoses   Final diagnoses:  Shortness of breath  Chest tightness    New Prescriptions Discharge Medication List as of 06/13/2016  1:48 PM       Alvira Monday, MD 06/13/16 2304

## 2016-06-13 NOTE — ED Notes (Signed)
Pt children given milk and graham crackers.

## 2016-06-13 NOTE — ED Notes (Signed)
Pt is in stable condition upon d/c and ambulates from ED. 

## 2016-10-16 ENCOUNTER — Ambulatory Visit (INDEPENDENT_AMBULATORY_CARE_PROVIDER_SITE_OTHER): Payer: 59 | Admitting: Family Medicine

## 2016-10-16 ENCOUNTER — Encounter: Payer: Self-pay | Admitting: Family Medicine

## 2016-10-16 VITALS — BP 138/88 | HR 84 | Temp 98.2°F | Resp 16 | Ht 66.0 in | Wt 336.0 lb

## 2016-10-16 DIAGNOSIS — F4323 Adjustment disorder with mixed anxiety and depressed mood: Secondary | ICD-10-CM | POA: Diagnosis not present

## 2016-10-16 DIAGNOSIS — Z9109 Other allergy status, other than to drugs and biological substances: Secondary | ICD-10-CM | POA: Diagnosis not present

## 2016-10-16 DIAGNOSIS — K219 Gastro-esophageal reflux disease without esophagitis: Secondary | ICD-10-CM | POA: Diagnosis not present

## 2016-10-16 DIAGNOSIS — Z91018 Allergy to other foods: Secondary | ICD-10-CM | POA: Diagnosis not present

## 2016-10-16 DIAGNOSIS — Z720 Tobacco use: Secondary | ICD-10-CM | POA: Diagnosis not present

## 2016-10-16 LAB — CBC
HEMATOCRIT: 36.9 % (ref 35.0–45.0)
Hemoglobin: 11.5 g/dL — ABNORMAL LOW (ref 11.7–15.5)
MCH: 24.7 pg — ABNORMAL LOW (ref 27.0–33.0)
MCHC: 31.2 g/dL — ABNORMAL LOW (ref 32.0–36.0)
MCV: 79.4 fL — AB (ref 80.0–100.0)
MPV: 9.8 fL (ref 7.5–12.5)
PLATELETS: 379 10*3/uL (ref 140–400)
RBC: 4.65 MIL/uL (ref 3.80–5.10)
RDW: 16.8 % — AB (ref 11.0–15.0)
WBC: 6.7 10*3/uL (ref 3.8–10.8)

## 2016-10-16 LAB — LIPID PANEL
CHOLESTEROL: 195 mg/dL (ref <200)
HDL: 55 mg/dL (ref >50)
LDL CALC: 126 mg/dL — AB (ref <100)
TRIGLYCERIDES: 72 mg/dL (ref <150)
Total CHOL/HDL Ratio: 3.5 Ratio (ref <5.0)
VLDL: 14 mg/dL (ref <30)

## 2016-10-16 LAB — COMPLETE METABOLIC PANEL WITH GFR
ALK PHOS: 74 U/L (ref 33–115)
ALT: 13 U/L (ref 6–29)
AST: 11 U/L (ref 10–30)
Albumin: 3.6 g/dL (ref 3.6–5.1)
BILIRUBIN TOTAL: 0.4 mg/dL (ref 0.2–1.2)
BUN: 12 mg/dL (ref 7–25)
CALCIUM: 8.7 mg/dL (ref 8.6–10.2)
CO2: 25 mmol/L (ref 20–32)
CREATININE: 0.7 mg/dL (ref 0.50–1.10)
Chloride: 106 mmol/L (ref 98–110)
Glucose, Bld: 98 mg/dL (ref 65–99)
Potassium: 4.4 mmol/L (ref 3.5–5.3)
Sodium: 139 mmol/L (ref 135–146)
TOTAL PROTEIN: 6.5 g/dL (ref 6.1–8.1)

## 2016-10-16 MED ORDER — ALPRAZOLAM 0.5 MG PO TABS: 0.5000 mg | ORAL_TABLET | Freq: Every evening | ORAL | 0 refills | Status: DC | PRN

## 2016-10-16 MED ORDER — FLUOXETINE HCL 20 MG PO TABS
20.0000 mg | ORAL_TABLET | Freq: Every day | ORAL | 3 refills | Status: DC
Start: 2016-10-16 — End: 2017-11-22

## 2016-10-16 NOTE — Progress Notes (Signed)
Chief Complaint  Patient presents with  . Follow-up    est   New patient  Recently moved to area I have discussed the multiple health risks associated with cigarette smoking including, but not limited to, cardiovascular disease, lung disease and cancer.  I have strongly recommended that smoking be stopped.  I have reviewed the various methods of quitting including cold turkey, classes, nicot[place]ine replacements and prescription medications.  I have offered assistance in this difficult process.  The patient is not interested in assistance at this time. Her biggest acute issue is depression.  Is labile.  Feels stressed as single mother with 5 children at home.  Previously took medicine.  Does not remember name.  No thoughts of harming self or others.  Has gained weight.  sleeps poorly.     Patient Active Problem List   Diagnosis Date Noted  . GERD without esophagitis 10/16/2016  . Morbid obesity (HCC) 10/16/2016  . Environmental allergies 10/16/2016  . H/O food allergy 10/16/2016  . Tobacco abuse 10/16/2016  . Situational mixed anxiety and depressive disorder 10/16/2016    Outpatient Encounter Prescriptions as of 10/16/2016  Medication Sig  . ibuprofen (ADVIL,MOTRIN) 200 MG tablet Take 200 mg by mouth every 8 (eight) hours as needed.  . ALPRAZolam (XANAX) 0.5 MG tablet Take 1 tablet (0.5 mg total) by mouth at bedtime as needed for anxiety.  Marland Kitchen. FLUoxetine (PROZAC) 20 MG tablet Take 1 tablet (20 mg total) by mouth daily.   No facility-administered encounter medications on file as of 10/16/2016.     Past Medical History:  Diagnosis Date  . Allergy   . Anemia   . Anxiety   . Depression   . GERD (gastroesophageal reflux disease)     Past Surgical History:  Procedure Laterality Date  . CESAREAN SECTION    . CHOLECYSTECTOMY      Social History   Social History  . Marital status: Divorced    Spouse name: N/A  . Number of children: 6  . Years of education: 7613   Occupational  History  . customer service     [employer]   Social History Main Topics  . Smoking status: Current Every Day Smoker    Packs/day: 0.50    Types: Cigarettes    Start date: 03/10/1998  . Smokeless tobacco: Never Used  . Alcohol use Yes     Comment: social  . Drug use: No  . Sexual activity: Not Currently   Other Topics Concern  . Not on file   Social History Narrative   Lives with 5 of 6 children    Family History  Problem Relation Age of Onset  . Arthritis Mother   . Hyperlipidemia Mother   . Hypertension Mother   . Miscarriages / [place] Mother   . Lupus Mother   . Arthritis Father   . Diabetes Father   . Hyperlipidemia Father   . Hypertension Father   . Alzheimer's disease Maternal Grandmother   . Heart disease Maternal Grandfather     Review of Systems  Constitutional: Negative for chills, fever and weight loss.  HENT: Negative for congestion and hearing loss.   Eyes: Negative for blurred vision and pain.  Respiratory: Negative for cough and shortness of breath.   Cardiovascular: Negative for chest pain and leg swelling.  Gastrointestinal: Negative for abdominal pain, constipation, diarrhea and heartburn.  Genitourinary: Negative for dysuria and frequency.  Musculoskeletal: Negative for falls, joint pain and myalgias.  Neurological: Negative for dizziness, seizures  and headaches.  Psychiatric/Behavioral: Positive for depression. Negative for suicidal ideas. The patient has insomnia. The patient is not nervous/anxious.     BP 138/88 (BP Location: Right Arm, Patient Position: Sitting, Cuff Size: Large)   Pulse 84   Temp 98.2 F (36.8 C) (Temporal)   Resp 16   Ht 5\' 6"  (1.676 m)   Wt (!) 336 lb 0.6 oz (152.4 kg)   LMP 09/28/2016 (Exact Date)   SpO2 100%   BMI 54.24 kg/m   Physical Exam  Constitutional: She is oriented to person, place, and time. She appears well-developed and well-nourished. No distress.  Morbid obesity  HENT:  Head: Normocephalic and  atraumatic.  Mouth/Throat: Oropharynx is clear and moist.  Cardiovascular: Normal rate, regular rhythm and normal heart sounds.   Pulmonary/Chest: Effort normal and breath sounds normal.  Musculoskeletal: Normal range of motion. She exhibits no edema.  Valgus knees  Neurological: She is alert and oriented to person, place, and time.  Psychiatric: Her behavior is normal. Judgment and thought content normal.  Emotional lability  ASSESSMENT/PLAN: Greater than 50% of this visit was spent in counseling and coordinating care.  Total face to face time:  30 min is discussing depression, SSRI, medicines, expectations, self care   1. Chronic GERD  2. Morbid obesity (HCC) - CBC - COMPLETE METABOLIC PANEL WITH GFR - Lipid panel - Hemoglobin A1c - VITAMIN D 25 Hydroxy (Vit-D Deficiency, Fractures) - Urinalysis, Routine w reflex microscopic  3. Environmental allergies  4. H/O food allergy  5. Situational mixed anxiety and depressive disorder  6. Tobacco abuse    Patient Instructions  Take the fluoxetine once a day in the morning  Need lab testing today  Try to quit smoking  Walk every day that you are able  Need old records  See me in one month   Eustace Moore, MD

## 2016-10-16 NOTE — Patient Instructions (Addendum)
Take the fluoxetine once a day in the morning  Need lab testing today  Try to quit smoking  Walk every day that you are able  Need old records  See me in one month

## 2016-10-17 ENCOUNTER — Encounter: Payer: Self-pay | Admitting: Family Medicine

## 2016-10-17 DIAGNOSIS — E559 Vitamin D deficiency, unspecified: Secondary | ICD-10-CM | POA: Insufficient documentation

## 2016-10-17 LAB — URINALYSIS, ROUTINE W REFLEX MICROSCOPIC
Bilirubin Urine: NEGATIVE
GLUCOSE, UA: NEGATIVE
Hgb urine dipstick: NEGATIVE
Ketones, ur: NEGATIVE
LEUKOCYTES UA: NEGATIVE
NITRITE: NEGATIVE
PH: 5.5 (ref 5.0–8.0)
PROTEIN: NEGATIVE
Specific Gravity, Urine: 1.024 (ref 1.001–1.035)

## 2016-10-17 LAB — URINALYSIS, MICROSCOPIC ONLY
BACTERIA UA: NONE SEEN [HPF]
CASTS: NONE SEEN [LPF]
CRYSTALS: NONE SEEN [HPF]
RBC / HPF: NONE SEEN RBC/HPF (ref <=2)
WBC, UA: NONE SEEN WBC/HPF (ref <=5)
Yeast: NONE SEEN [HPF]

## 2016-10-17 LAB — HEMOGLOBIN A1C
Hgb A1c MFr Bld: 5.5 % (ref <5.7)
Mean Plasma Glucose: 111 mg/dL

## 2016-10-17 LAB — VITAMIN D 25 HYDROXY (VIT D DEFICIENCY, FRACTURES): VIT D 25 HYDROXY: 8 ng/mL — AB (ref 30–100)

## 2016-11-19 ENCOUNTER — Ambulatory Visit: Payer: 59 | Admitting: Family Medicine

## 2016-11-19 ENCOUNTER — Telehealth: Payer: Self-pay | Admitting: Family Medicine

## 2016-11-19 NOTE — Telephone Encounter (Signed)
FYI: patient called and left message on voice mail @ 4:33 11-18-16 stating that she would like to cancel her appt with Dr Delton SeeNelson for 11/19/16 @ 8:40.  Patient is marked as NO SHOW as she did not give 24 hours notice to the cancellation.

## 2017-04-28 ENCOUNTER — Emergency Department (HOSPITAL_COMMUNITY)
Admission: EM | Admit: 2017-04-28 | Discharge: 2017-04-28 | Disposition: A | Discharge status: Home / Self Care | Payer: Medicaid Other | Attending: Emergency Medicine | Admitting: Emergency Medicine

## 2017-04-28 ENCOUNTER — Encounter (HOSPITAL_COMMUNITY): Payer: Self-pay | Admitting: Emergency Medicine

## 2017-04-28 DIAGNOSIS — Z79899 Other long term (current) drug therapy: Secondary | ICD-10-CM | POA: Insufficient documentation

## 2017-04-28 DIAGNOSIS — L509 Urticaria, unspecified: Secondary | ICD-10-CM | POA: Diagnosis not present

## 2017-04-28 DIAGNOSIS — F17210 Nicotine dependence, cigarettes, uncomplicated: Secondary | ICD-10-CM | POA: Insufficient documentation

## 2017-04-28 DIAGNOSIS — R220 Localized swelling, mass and lump, head: Secondary | ICD-10-CM | POA: Insufficient documentation

## 2017-04-28 MED ORDER — DIPHENHYDRAMINE HCL 25 MG PO CAPS
25.0000 mg | ORAL_CAPSULE | Freq: Once | ORAL | Status: AC
  Administered 2017-04-28: 25 mg via ORAL
  Filled 2017-04-28: qty 1

## 2017-04-28 MED ORDER — FAMOTIDINE 20 MG PO TABS: 20.0000 mg | ORAL_TABLET | Freq: Two times a day (BID) | ORAL | 0 refills | Status: DC

## 2017-04-28 MED ORDER — PREDNISONE 10 MG PO TABS: ORAL_TABLET | ORAL | 0 refills | Status: DC

## 2017-04-28 MED ORDER — DEXAMETHASONE SODIUM PHOSPHATE 10 MG/ML IJ SOLN
10.0000 mg | Freq: Once | INTRAMUSCULAR | Status: AC
  Administered 2017-04-28: 10 mg via INTRAMUSCULAR
  Filled 2017-04-28: qty 1

## 2017-04-28 MED ORDER — FAMOTIDINE 20 MG PO TABS
ORAL_TABLET | ORAL | Status: AC
  Filled 2017-04-28: qty 1

## 2017-04-28 MED ORDER — FAMOTIDINE 20 MG PO TABS
20.0000 mg | ORAL_TABLET | Freq: Once | ORAL | Status: AC
  Administered 2017-04-28: 20 mg via ORAL
  Filled 2017-04-28: qty 1

## 2017-04-28 NOTE — ED Provider Notes (Signed)
[name] Provider Note   CSN: [id number] Arrival date & time: 04/28/17  0732     History   Chief Complaint No chief complaint on file.   HPI Kylie Robbins is a 42 y.o. female with a history of seasonal allergies, GERD and anxiety presenting with a several week history of intermittent migratory and pruritic rash which she has been treating with Benadryl which will briefly improve the itching but does not resolve the rash.  2 days ago she also developed swelling of her upper lip which has resolved but woke this morning with his lower lip feeling slightly enlarged.  She denies shortness of breath, mouth throat or tongue swelling, no wheezing.  She has no known new exposures to foods, medications, skin care products.  She has recently moved to a new home, but her symptoms started before this new environment.  She does have multiple food allergies including certain nuts and fish but has been very careful about avoiding these foods.  The history is provided by the patient.    Past Medical History:  Diagnosis Date  . Allergy   . Anemia   . Anxiety   . Depression   . GERD (gastroesophageal reflux disease)     Patient Active Problem List   Diagnosis Date Noted  . Vitamin D deficiency 10/17/2016  . GERD without esophagitis 10/16/2016  . Morbid obesity (HCC) 10/16/2016  . Environmental allergies 10/16/2016  . H/O food allergy 10/16/2016  . Tobacco abuse 10/16/2016  . 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 CESAREAN SECTION    . CHOLECYSTECTOMY      OB History    No data available       Home Medications    Prior to Admission medications   Medication Sig Start Date End Date Taking? Authorizing Provider  ALPRAZolam Prudy Feeler(XANAX) 0.5 MG tablet Take 1 tablet (0.5 mg total) by mouth at bedtime as needed for anxiety. 10/16/16   Eustace MooreNelson, Yvonne Sue, MD  famotidine (PEPCID) 20 MG  tablet Take 1 tablet (20 mg total) by mouth 2 (two) times daily. 04/28/17   Burgess AmorIdol, Jace Fermin, PA-C  FLUoxetine (PROZAC) 20 MG tablet Take 1 tablet (20 mg total) by mouth daily. 10/16/16   Eustace MooreNelson, Yvonne Sue, MD  ibuprofen (ADVIL,MOTRIN) 200 MG tablet Take 200 mg by mouth every 8 (eight) hours as needed.    [provider]  predniSONE (DELTASONE) 10 MG tablet Take 6 tabs daily by mouth for 2 day,  Then 5 tabs daily for 2 days,  4 tabs daily for 2 days,  3 tabs daily for 2 days,  2 tabs daily for 2 days,  Then 1 tab daily for 2 days. 04/29/17   Burgess AmorIdol, Ikram Riebe, PA-C    Family History Family History  Problem Relation Age of Onset  . Arthritis Mother   . Hyperlipidemia Mother   . Hypertension Mother   . Miscarriages / [place] Mother   . Lupus Mother   . Arthritis Father   . Diabetes Father   . Hyperlipidemia Father   . Hypertension Father   . Alzheimer's disease Maternal Grandmother   . Heart disease Maternal Grandfather     Social History Social History   Tobacco Use  . Smoking status: Current Every Day Smoker    Packs/day: 0.50    Types: Cigarettes    Start date: 03/10/1998  . Smokeless tobacco: Never Used  Substance Use Topics  .  Alcohol use: Yes    Comment: social  . Drug use: No     Allergies   Other   Review of Systems Review of Systems  Constitutional: Negative for chills and fever.  HENT: Positive for facial swelling.   Respiratory: Negative for shortness of breath and wheezing.   Skin: Positive for rash.  Neurological: Negative for numbness.     Physical Exam Updated Vital Signs BP (!) 156/113 (BP Location: Right Wrist)   Pulse 92   Temp 98.4 F (36.9 C) (Oral)   Resp 18   Ht 5\' 7"  (1.702 m)   Wt (!) 167.8 kg (370 lb)   LMP 04/26/2017 (Exact Date)   SpO2 100%   BMI 57.95 kg/m   Physical Exam  Constitutional: She appears well-developed and well-nourished. No distress.  HENT:  Head: Normocephalic.  No lip tongue or pharyngeal swelling noted on  exam.  Neck: Neck supple.  Cardiovascular: Normal rate.  Pulmonary/Chest: Effort normal. No stridor. No respiratory distress. She has no wheezes.  Musculoskeletal: Normal range of motion. She exhibits no edema.  Skin: Rash noted. Rash is urticarial.  Several urticarial lesions on her left lateral chest and back, right shoulder, also her left thigh.     ED Treatments / Results  Labs (all labs ordered are listed, but only abnormal results are displayed) Labs Reviewed - No data to display  EKG  EKG Interpretation None       Radiology No results found.  Procedures Procedures (including critical care time)  Medications Ordered in ED Medications  dexamethasone (DECADRON) injection 10 mg (not administered)  famotidine (PEPCID) tablet 20 mg (not administered)  diphenhydrAMINE (BENADRYL) capsule 25 mg (not administered)     Initial Impression / Assessment and Plan / ED Course  I have reviewed the triage vital signs and the nursing notes.  Pertinent labs & imaging results that were available during my care of the patient were reviewed by me and considered in my medical decision making (see chart for details).     No respiratory distress, no lip tongue or throat edema, wheezing or stridor noted on today's exam.  She was encouraged to continue taking her home Benadryl, I will add Pepcid and prednisone taper to this regimen.  She was given these medications here including a dose of Decadron IM.  Advised close follow-up by returning here or seen her PCP for any worsening symptoms, or if symptoms return once the prednisone taper has finished.  Patient expresses concern that her mother has a history of lupus who states some of her early symptoms were hives.  Discussed follow-up with her PCP regarding this as she may need testing to rule out this possible diagnosis if her symptoms persist.  Patient agrees with plan.  Initial blood pressure was elevated which was discussed with patient.   Recheck of blood pressure improved.  Final Clinical Impressions(s) / ED Diagnoses   Final diagnoses:  Hives    ED Discharge Orders        Ordered    predniSONE (DELTASONE) 10 MG tablet     04/28/17 0934    famotidine (PEPCID) 20 MG tablet  2 times daily     04/28/17 0934       Burgess Amor, PA-C 04/28/17 0943    Burgess Amor, PA-C 04/28/17 1610    Marily Memos, MD 04/28/17 1534

## 2017-04-28 NOTE — Discharge Instructions (Signed)
Take your first dose of the prednisone prescription tomorrow morning, remember this is a tapering dose over the next 12 days.  Take Pepcid as prescribed for at least the next 5 days, but complete the full 7 days if you continue to have symptoms of rash.  Continue taking your Benadryl 25 mg every 6 hours as you are currently doing.

## 2017-04-28 NOTE — ED Triage Notes (Signed)
Patient has had itching spots on legs that travel up to back.  Lip swelling on Sunday.  Has been taking benadryl with little to no relief

## 2017-05-18 ENCOUNTER — Encounter: Payer: Self-pay | Admitting: Family Medicine

## 2017-06-12 ENCOUNTER — Other Ambulatory Visit: Payer: Self-pay | Admitting: Family Medicine

## 2017-07-16 ENCOUNTER — Emergency Department (HOSPITAL_COMMUNITY): Payer: Medicaid Other

## 2017-07-16 ENCOUNTER — Emergency Department (HOSPITAL_COMMUNITY)
Admission: EM | Admit: 2017-07-16 | Discharge: 2017-07-16 | Disposition: A | Discharge status: Home / Self Care | Payer: Medicaid Other | Attending: Emergency Medicine | Admitting: Emergency Medicine

## 2017-07-16 ENCOUNTER — Encounter (HOSPITAL_COMMUNITY): Payer: Self-pay | Admitting: Emergency Medicine

## 2017-07-16 ENCOUNTER — Other Ambulatory Visit: Payer: Self-pay

## 2017-07-16 DIAGNOSIS — X501XXA Overexertion from prolonged static or awkward postures, initial encounter: Secondary | ICD-10-CM | POA: Diagnosis not present

## 2017-07-16 DIAGNOSIS — W19XXXA Unspecified fall, initial encounter: Secondary | ICD-10-CM | POA: Diagnosis not present

## 2017-07-16 DIAGNOSIS — F17210 Nicotine dependence, cigarettes, uncomplicated: Secondary | ICD-10-CM | POA: Diagnosis not present

## 2017-07-16 DIAGNOSIS — S93401A Sprain of unspecified ligament of right ankle, initial encounter: Secondary | ICD-10-CM | POA: Diagnosis not present

## 2017-07-16 DIAGNOSIS — Y939 Activity, unspecified: Secondary | ICD-10-CM | POA: Insufficient documentation

## 2017-07-16 DIAGNOSIS — S99911A Unspecified injury of right ankle, initial encounter: Secondary | ICD-10-CM | POA: Diagnosis present

## 2017-07-16 DIAGNOSIS — Y999 Unspecified external cause status: Secondary | ICD-10-CM | POA: Diagnosis not present

## 2017-07-16 DIAGNOSIS — Y929 Unspecified place or not applicable: Secondary | ICD-10-CM | POA: Insufficient documentation

## 2017-07-16 DIAGNOSIS — Z79899 Other long term (current) drug therapy: Secondary | ICD-10-CM | POA: Insufficient documentation

## 2017-07-16 MED ORDER — IBUPROFEN 400 MG PO TABS
400.0000 mg | ORAL_TABLET | Freq: Once | ORAL | Status: AC
  Administered 2017-07-16: 400 mg via ORAL
  Filled 2017-07-16: qty 1

## 2017-07-16 MED ORDER — HYDROCODONE-ACETAMINOPHEN 5-325 MG PO TABS: 2.0000 | ORAL_TABLET | ORAL | 0 refills | Status: DC | PRN

## 2017-07-16 MED ORDER — IBUPROFEN 600 MG PO TABS: 600.0000 mg | ORAL_TABLET | Freq: Four times a day (QID) | ORAL | 0 refills | Status: DC | PRN

## 2017-07-16 MED ORDER — HYDROCODONE-ACETAMINOPHEN 5-325 MG PO TABS
1.0000 | ORAL_TABLET | Freq: Once | ORAL | Status: AC
  Administered 2017-07-16: 1 via ORAL
  Filled 2017-07-16: qty 1

## 2017-07-16 NOTE — ED Triage Notes (Signed)
Pt fell this morning.  States " I just got tripped up".  C/o of right knee and ankle pain

## 2017-07-16 NOTE — ED Provider Notes (Signed)
[facility] EMERGENCY DEPARTMENT Provider Note   CSN: [id number] Arrival date & time: 07/16/17  1715     History   Chief Complaint Chief Complaint  Patient presents with  . Knee Pain  . Ankle Pain    HPI Kylie Robbins is a 42 y.o. female.  The history is provided by the patient. No language interpreter was used.  Knee Pain   This is a new problem. The current episode started 6 to 12 hours ago. The problem occurs constantly. The problem has been gradually worsening. The pain is present in the right knee and right ankle. The quality of the pain is described as aching. The pain is moderate. Associated symptoms include limited range of motion. She has tried nothing for the symptoms. The treatment provided no relief. There has been a history of trauma.  Ankle Pain    Pt fell and twisted ankle and knee.  Pt complains of pain with trying to walk. Ankle is swollen.  Pt reports knee feels like it is giving out.   Past Medical History:  Diagnosis Date  . Allergy   . Anemia   . Anxiety   . Depression   . GERD (gastroesophageal reflux disease)     Patient Active Problem List   Diagnosis Date Noted  . Vitamin D deficiency 10/17/2016  . GERD without esophagitis 10/16/2016  . Morbid obesity (HCC) 10/16/2016  . Environmental allergies 10/16/2016  . H/O food allergy 10/16/2016  . Tobacco abuse 10/16/2016  . 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 CESAREAN SECTION    . CHOLECYSTECTOMY       OB History   None      Home Medications    Prior to Admission medications   Medication Sig Start Date End Date Taking? Authorizing Provider  ALPRAZolam Prudy Feeler) 0.5 MG tablet Take 1 tablet (0.5 mg total) by mouth at bedtime as needed for anxiety. 10/16/16   Eustace Moore, MD  famotidine (PEPCID) 20 MG tablet Take 1 tablet (20 mg total) by mouth 2 (two) times daily. 04/28/17   Burgess Amor, PA-C    FLUoxetine (PROZAC) 20 MG tablet Take 1 tablet (20 mg total) by mouth daily. 10/16/16   Eustace Moore, MD  HYDROcodone-acetaminophen (NORCO/VICODIN) 5-325 MG tablet Take 2 tablets by mouth every 4 (four) hours as needed. 07/16/17   Elson Areas, PA-C  ibuprofen (ADVIL,MOTRIN) 600 MG tablet Take 1 tablet (600 mg total) by mouth every 6 (six) hours as needed. 07/16/17   Elson Areas, PA-C  predniSONE (DELTASONE) 10 MG tablet Take 6 tabs daily by mouth for 2 day,  Then 5 tabs daily for 2 days,  4 tabs daily for 2 days,  3 tabs daily for 2 days,  2 tabs daily for 2 days,  Then 1 tab daily for 2 days. 04/29/17   Burgess Amor, PA-C    Family History Family History  Problem Relation Age of Onset  . Arthritis Mother   . Hyperlipidemia Mother   . Hypertension Mother   . Miscarriages / [place] Mother   . Lupus Mother   . Arthritis Father   . Diabetes Father   . Hyperlipidemia Father   . Hypertension Father   . Alzheimer's disease Maternal Grandmother   . Heart disease Maternal Grandfather     Social History Social History   Tobacco Use  . Smoking status: Current Every Day Smoker  Packs/day: 0.50    Types: Cigarettes    Start date: 03/10/1998  . Smokeless tobacco: Never Used  Substance Use Topics  . Alcohol use: Yes    Comment: social  . Drug use: No     Allergies   Other   Review of Systems Review of Systems  Musculoskeletal: Positive for joint swelling and myalgias.  All other systems reviewed and are negative.    Physical Exam Updated Vital Signs Temp 98.1 F (36.7 C)   Ht  (1.676 m)   Wt (!) 154.2 kg (340 lb)   BMI 54.88 kg/m   Physical Exam  Constitutional: She appears well-developed and well-nourished.  Musculoskeletal: She exhibits tenderness.  Swollen right ankle and right knee,  Pain with moving,  nv and ns itnact  Neurological: She is alert.  Skin: Skin is warm.  Psychiatric: She has a normal mood and affect.  Nursing note and vitals  reviewed.    ED Treatments / Results  Labs (all labs ordered are listed, but only abnormal results are displayed) Labs Reviewed - No data to display  EKG None  Radiology Dg Ankle Complete Right  Result Date: 07/16/2017 CLINICAL DATA:  Pain after trip and fall this morning. EXAM: RIGHT ANKLE - COMPLETE 3+ VIEW COMPARISON:  None. FINDINGS: There is no evidence acute fracture joint dislocation. Moderate soft tissue swelling about the malleoli. Intact ankle mortise. No joint effusion. IMPRESSION: Periarticular soft tissue swelling more so about the malleoli. No acute fracture, malalignment nor bone destruction. Electronically Signed   By: Tollie Eth M.D.   On: 07/16/2017 18:18   Dg Knee Complete 4 Views Right  Result Date: 07/16/2017 CLINICAL DATA:  Generalized right knee pain after trip and fall this morning. EXAM: RIGHT KNEE - COMPLETE 4+ VIEW COMPARISON:  None. FINDINGS: Tiny flake avulsion off the tip of the fibula is suggested on the cross-table lateral view. Joint spaces are maintained. No joint effusion is seen. Remainder of the study is unremarkable. IMPRESSION: Tiny avulsion suspected off the tip of the fibula, only seen on the cross-table lateral projection however. Otherwise negative exam. Electronically Signed   By: Tollie Eth M.D.   On: 07/16/2017 18:21    Procedures Procedures (including critical care time)  Medications Ordered in ED Medications  ibuprofen (ADVIL,MOTRIN) tablet 400 mg (400 mg Oral Given 07/16/17 1910)  HYDROcodone-acetaminophen (NORCO/VICODIN) 5-325 MG per tablet 1 tablet (1 tablet Oral Given 07/16/17 1910)     Initial Impression / Assessment and Plan / ED Course  I have reviewed the triage vital signs and the nursing notes.  Pertinent labs & imaging results that were available during my care of the patient were reviewed by me and considered in my medical decision making (see chart for details).     Pt advised to follow up with Dr. Romeo Apple.  Pt placed in  an aso and ace wrap for knee.    Final Clinical Impressions(s) / ED Diagnoses   Final diagnoses:  Sprain of right ankle, unspecified ligament, initial encounter    ED Discharge Orders        Ordered    HYDROcodone-acetaminophen (NORCO/VICODIN) 5-325 MG tablet  Every 4 hours PRN     07/16/17 1904    ibuprofen (ADVIL,MOTRIN) 600 MG tablet  Every 6 hours PRN     07/16/17 1904    An After Visit Summary was printed and given to the patient.    Elson Areas, PA-C 07/17/17 0000    Benjiman Core, MD  07/17/17 0018  

## 2017-07-23 ENCOUNTER — Ambulatory Visit: Payer: Medicaid Other | Admitting: Orthopaedic Surgery

## 2017-07-23 ENCOUNTER — Encounter: Payer: Self-pay | Admitting: Orthopaedic Surgery

## 2017-07-23 VITALS — BP 141/92 | HR 91 | Ht 66.0 in | Wt 340.0 lb

## 2017-07-23 DIAGNOSIS — M25561 Pain in right knee: Secondary | ICD-10-CM | POA: Diagnosis not present

## 2017-07-23 DIAGNOSIS — S82831A Other fracture of upper and lower end of right fibula, initial encounter for closed fracture: Secondary | ICD-10-CM

## 2017-07-23 MED ORDER — HYDROCODONE-ACETAMINOPHEN 5-325 MG PO TABS: ORAL_TABLET | ORAL | 0 refills | Status: DC

## 2017-07-23 NOTE — Progress Notes (Signed)
Subjective:  My right knee hurts    Patient ID: Kylie Robbins, female    DOB: [birth year], 42 y.o.   MRN: [id number]  HPI She has had increasing pain of the right knee with giving way more and more recently.  She has no direct trauma, no redness.  On 07-16-17 her knee gave way again and she fell hard against the lateral side of the knee.  She went to the ER.  X-rays were done and showed most likely a fracture of the proximal fibula nondisplaced.  She was given crutches.  She continues to have pain of the right knee. She is using our wheelchair today.  She has no other joint problems.  I have reviewed the ER records and x-rays.   Review of Systems  Constitutional: Positive for activity change.  Musculoskeletal: Positive for arthralgias, gait problem and joint swelling.  Psychiatric/Behavioral: The patient is nervous/anxious.   All other systems reviewed and are negative.  Past Medical History:  Diagnosis Date  . Allergy   . Anemia   . Anxiety   . Depression   . GERD (gastroesophageal reflux disease)     Past Surgical History:  Procedure Laterality Date  . CESAREAN SECTION    . CHOLECYSTECTOMY      Current Outpatient Medications on File Prior to Visit  Medication Sig Dispense Refill  . ibuprofen (ADVIL,MOTRIN) 600 MG tablet Take 1 tablet (600 mg total) by mouth every 6 (six) hours as needed. 20 tablet 0  . ALPRAZolam (XANAX) 0.5 MG tablet Take 1 tablet (0.5 mg total) by mouth at bedtime as needed for anxiety. (Patient not taking: Reported on 07/23/2017) 15 tablet 0  . famotidine (PEPCID) 20 MG tablet Take 1 tablet (20 mg total) by mouth 2 (two) times daily. (Patient not taking: Reported on 07/23/2017) 14 tablet 0  . FLUoxetine (PROZAC) 20 MG tablet Take 1 tablet (20 mg total) by mouth daily. (Patient not taking: Reported on 07/23/2017) 30 tablet 3   No current facility-administered medications on file prior to visit.     Social History   Socioeconomic History  .  Marital status: Divorced    Spouse name: Not on file  . Number of children: 6  . Years of education: 53  . Highest education level: Not on file  Occupational History  . Occupation: customer service    Comment: [employer]  Social Needs  . Financial resource strain: Not on file  . Food insecurity:    Worry: Not on file    Inability: Not on file  . Transportation needs:    Medical: Not on file    Non-medical: Not on file  Tobacco Use  . Smoking status: Current Every Day Smoker    Packs/day: 0.50    Types: Cigarettes    Start date: 03/10/1998  . Smokeless tobacco: Never Used  Substance and Sexual Activity  . Alcohol use: Yes    Comment: social  . Drug use: No  . Sexual activity: Not Currently  Lifestyle  . Physical activity:    Days per week: Not on file    Minutes per session: Not on file  . Stress: Not on file  Relationships  . Social connections:    Talks on phone: Not on file    Gets together: Not on file    Attends religious service: Not on file    Active member of club or organization: Not on file    Attends meetings of clubs or organizations: Not on file  Relationship status: Not on file  . Intimate partner violence:    Fear of current or ex partner: Not on file    Emotionally abused: Not on file    Physically abused: Not on file    Forced sexual activity: Not on file  Other Topics Concern  . Not on file  Social History Narrative   Lives with 5 of 6 children    Family History  Problem Relation Age of Onset  . Arthritis Mother   . Hyperlipidemia Mother   . Hypertension Mother   . Miscarriages / [place] Mother   . Lupus Mother   . Arthritis Father   . Diabetes Father   . Hyperlipidemia Father   . Hypertension Father   . Alzheimer's disease Maternal Grandmother   . Heart disease Maternal Grandfather     BP (!) 141/92   Pulse 91   Ht  (1.676 m)   Wt (!) 340 lb (154.2 kg)   BMI 54.88 kg/m      Objective:   Physical Exam  Constitutional: She  is oriented to person, place, and time. She appears well-developed and well-nourished.  Morbidly obese  HENT:  Head: Normocephalic and atraumatic.  Eyes: Pupils are equal, round, and reactive to light. Conjunctivae and EOM are normal.  Neck: Normal range of motion. Neck supple.  Cardiovascular: Normal rate, regular rhythm and intact distal pulses.  Pulmonary/Chest: Effort normal.  Abdominal: Soft.  Musculoskeletal:       Right knee: She exhibits decreased range of motion, swelling and effusion. Tenderness found. Lateral joint line tenderness noted.       Legs: Neurological: She is alert and oriented to person, place, and time. She has normal reflexes. She displays normal reflexes. No cranial nerve deficit. She exhibits normal muscle tone. Coordination normal.  Skin: Skin is warm and dry.  Psychiatric: She has a normal mood and affect. Her behavior is normal. Judgment and thought content normal.          Assessment & Plan:   Encounter Diagnoses  Name Primary?  . Fracture of head of fibula, right, closed, initial encounter Yes  . Acute pain of right knee    I am concerned of meniscus tear of the right knee as her knee is giving way.  She will have to wait six weeks before MRI.  Rx given for a walker.  Keep weight off leg on the right.  Return in two weeks.  X-rays of the right knee on return.  Call if any problem.  Precautions discussed.   Electronically Signed Darreld Mclean, MD 5/16/201911:08 AM

## 2017-07-23 NOTE — Patient Instructions (Signed)
Steps to Quit Smoking Smoking tobacco can be bad for your health. It can also affect almost every organ in your body. Smoking puts you and people around you at risk for many serious long-lasting (chronic) diseases. Quitting smoking is hard, but it is one of the best things that you can do for your health. It is never too late to quit. What are the benefits of quitting smoking? When you quit smoking, you lower your risk for getting serious diseases and conditions. They can include:  Lung cancer or lung disease.  Heart disease.  Stroke.  Heart attack.  Not being able to have children (infertility).  Weak bones (osteoporosis) and broken bones (fractures).  If you have coughing, wheezing, and shortness of breath, those symptoms may get better when you quit. You may also get sick less often. If you are pregnant, quitting smoking can help to lower your chances of having a baby of low birth weight. What can I do to help me quit smoking? Talk with your doctor about what can help you quit smoking. Some things you can do (strategies) include:  Quitting smoking totally, instead of slowly cutting back how much you smoke over a period of time.  Going to in-person counseling. You are more likely to quit if you go to many counseling sessions.  Using resources and support systems, such as: ? Online chats with a counselor. ? Phone quitlines. ? Printed self-help materials. ? Support groups or group counseling. ? Text messaging programs. ? Mobile phone apps or applications.  Taking medicines. Some of these medicines may have nicotine in them. If you are pregnant or breastfeeding, do not take any medicines to quit smoking unless your doctor says it is okay. Talk with your doctor about counseling or other things that can help you.  Talk with your doctor about using more than one strategy at the same time, such as taking medicines while you are also going to in-person counseling. This can help make  quitting easier. What things can I do to make it easier to quit? Quitting smoking might feel very hard at first, but there is a lot that you can do to make it easier. Take these steps:  Talk to your family and friends. Ask them to support and encourage you.  Call phone quitlines, reach out to support groups, or work with a counselor.  Ask people who smoke to not smoke around you.  Avoid places that make you want (trigger) to smoke, such as: ? Bars. ? Parties. ? Smoke-break areas at work.  Spend time with people who do not smoke.  Lower the stress in your life. Stress can make you want to smoke. Try these things to help your stress: ? Getting regular exercise. ? Deep-breathing exercises. ? Yoga. ? Meditating. ? Doing a body scan. To do this, close your eyes, focus on one area of your body at a time from head to toe, and notice which parts of your body are tense. Try to relax the muscles in those areas.  Download or buy apps on your mobile phone or tablet that can help you stick to your quit plan. There are many free apps, such as QuitGuide from the CDC (Centers for Disease Control and Prevention). You can find more support from smokefree.gov and other websites.  This information is not intended to replace advice given to you by your health care provider. Make sure you discuss any questions you have with your health care provider. Document Released: 12/21/2008 Document   Revised: 10/23/2015 Document Reviewed: 07/11/2014 Elsevier Interactive Patient Education  2018 Elsevier Inc.  

## 2017-08-04 ENCOUNTER — Other Ambulatory Visit: Payer: Self-pay | Admitting: Orthopaedic Surgery

## 2017-08-04 NOTE — Telephone Encounter (Signed)
DR Kirtland Bouchard

## 2017-08-05 NOTE — Telephone Encounter (Signed)
No more narcotics.  Take Advil or Aleve.

## 2017-08-06 ENCOUNTER — Ambulatory Visit (INDEPENDENT_AMBULATORY_CARE_PROVIDER_SITE_OTHER): Payer: Self-pay | Admitting: Orthopaedic Surgery

## 2017-08-06 ENCOUNTER — Ambulatory Visit (INDEPENDENT_AMBULATORY_CARE_PROVIDER_SITE_OTHER): Payer: Medicaid Other

## 2017-08-06 ENCOUNTER — Encounter: Payer: Self-pay | Admitting: Orthopaedic Surgery

## 2017-08-06 VITALS — BP 156/103 | HR 91 | Temp 97.6°F | Ht 66.0 in | Wt 357.0 lb

## 2017-08-06 DIAGNOSIS — S82831D Other fracture of upper and lower end of right fibula, subsequent encounter for closed fracture with routine healing: Secondary | ICD-10-CM

## 2017-08-06 NOTE — Progress Notes (Signed)
CC:  My knee is better  She is using her walker.  She has less pain of the right knee.  NV intact. ROM is full of the right knee.  She is tender over the fibular head area.  X-rays were done, reported separately.  Encounter Diagnosis  Name Primary?  . Fracture of head of fibula, right, closed, with routine healing, subsequent encounter Yes   Return in three weeks.  X-rays on return.  Call if any problem.  Precautions discussed.   Electronically Signed Darreld Mclean, MD 5/30/201910:04 AM

## 2017-08-27 ENCOUNTER — Encounter: Payer: Self-pay | Admitting: Orthopaedic Surgery

## 2017-08-27 ENCOUNTER — Ambulatory Visit (INDEPENDENT_AMBULATORY_CARE_PROVIDER_SITE_OTHER): Payer: Medicaid Other

## 2017-08-27 ENCOUNTER — Ambulatory Visit: Payer: Self-pay | Admitting: Orthopaedic Surgery

## 2017-08-27 DIAGNOSIS — S82831D Other fracture of upper and lower end of right fibula, subsequent encounter for closed fracture with routine healing: Secondary | ICD-10-CM

## 2017-08-27 NOTE — Progress Notes (Signed)
CC:  My leg does not hurt  She has no pain of the proximal fibula area.    Gait is normal. NV intact.  X-rays were done of the right knee, reported separately.  Encounter Diagnosis  Name Primary?  . Fracture of head of fibula, right, closed, with routine healing, subsequent encounter Yes   I will see as needed.  Call if any problem.  Precautions discussed.   Electronically Signed Darreld McleanWayne Victoriya Pol, MD 6/20/20199:35 AM

## 2017-08-27 NOTE — Patient Instructions (Signed)
Steps to Quit Smoking Smoking tobacco can be bad for your health. It can also affect almost every organ in your body. Smoking puts you and people around you at risk for many serious Leyan Branden-lasting (chronic) diseases. Quitting smoking is hard, but it is one of the best things that you can do for your health. It is never too late to quit. What are the benefits of quitting smoking? When you quit smoking, you lower your risk for getting serious diseases and conditions. They can include:  Lung cancer or lung disease.  Heart disease.  Stroke.  Heart attack.  Not being able to have children (infertility).  Weak bones (osteoporosis) and broken bones (fractures).  If you have coughing, wheezing, and shortness of breath, those symptoms may get better when you quit. You may also get sick less often. If you are pregnant, quitting smoking can help to lower your chances of having a baby of low birth weight. What can I do to help me quit smoking? Talk with your doctor about what can help you quit smoking. Some things you can do (strategies) include:  Quitting smoking totally, instead of slowly cutting back how much you smoke over a period of time.  Going to in-person counseling. You are more likely to quit if you go to many counseling sessions.  Using resources and support systems, such as: ? Online chats with a counselor. ? Phone quitlines. ? Printed self-help materials. ? Support groups or group counseling. ? Text messaging programs. ? Mobile phone apps or applications.  Taking medicines. Some of these medicines may have nicotine in them. If you are pregnant or breastfeeding, do not take any medicines to quit smoking unless your doctor says it is okay. Talk with your doctor about counseling or other things that can help you.  Talk with your doctor about using more than one strategy at the same time, such as taking medicines while you are also going to in-person counseling. This can help make  quitting easier. What things can I do to make it easier to quit? Quitting smoking might feel very hard at first, but there is a lot that you can do to make it easier. Take these steps:  Talk to your family and friends. Ask them to support and encourage you.  Call phone quitlines, reach out to support groups, or work with a counselor.  Ask people who smoke to not smoke around you.  Avoid places that make you want (trigger) to smoke, such as: ? Bars. ? Parties. ? Smoke-break areas at work.  Spend time with people who do not smoke.  Lower the stress in your life. Stress can make you want to smoke. Try these things to help your stress: ? Getting regular exercise. ? Deep-breathing exercises. ? Yoga. ? Meditating. ? Doing a body scan. To do this, close your eyes, focus on one area of your body at a time from head to toe, and notice which parts of your body are tense. Try to relax the muscles in those areas.  Download or buy apps on your mobile phone or tablet that can help you stick to your quit plan. There are many free apps, such as QuitGuide from the CDC (Centers for Disease Control and Prevention). You can find more support from smokefree.gov and other websites.  This information is not intended to replace advice given to you by your health care provider. Make sure you discuss any questions you have with your health care provider. Document Released: 12/21/2008 Document   Revised: 10/23/2015 Document Reviewed: 07/11/2014 Elsevier Interactive Patient Education  2018 Elsevier Inc.  

## 2017-11-22 ENCOUNTER — Encounter (HOSPITAL_COMMUNITY): Payer: Self-pay | Admitting: Emergency Medicine

## 2017-11-22 ENCOUNTER — Emergency Department (HOSPITAL_COMMUNITY): Payer: Managed Care, Other (non HMO)

## 2017-11-22 ENCOUNTER — Emergency Department (HOSPITAL_COMMUNITY)
Admission: EM | Admit: 2017-11-22 | Discharge: 2017-11-22 | Disposition: A | Discharge status: Home / Self Care | Payer: Managed Care, Other (non HMO) | Attending: Emergency Medicine | Admitting: Emergency Medicine

## 2017-11-22 DIAGNOSIS — R51 Headache: Secondary | ICD-10-CM

## 2017-11-22 DIAGNOSIS — I10 Essential (primary) hypertension: Secondary | ICD-10-CM | POA: Insufficient documentation

## 2017-11-22 DIAGNOSIS — R519 Headache, unspecified: Secondary | ICD-10-CM

## 2017-11-22 DIAGNOSIS — Z87891 Personal history of nicotine dependence: Secondary | ICD-10-CM | POA: Diagnosis not present

## 2017-11-22 LAB — CBC WITH DIFFERENTIAL/PLATELET
BASOS PCT: 1 %
Basophils Absolute: 0 10*3/uL (ref 0.0–0.1)
EOS PCT: 2 %
Eosinophils Absolute: 0.1 10*3/uL (ref 0.0–0.7)
HEMATOCRIT: 34.7 % — AB (ref 36.0–46.0)
Hemoglobin: 10.7 g/dL — ABNORMAL LOW (ref 12.0–15.0)
Lymphocytes Relative: 36 %
Lymphs Abs: 2.5 10*3/uL (ref 0.7–4.0)
MCH: 23.7 pg — ABNORMAL LOW (ref 26.0–34.0)
MCHC: 30.8 g/dL (ref 30.0–36.0)
MCV: 76.8 fL — ABNORMAL LOW (ref 78.0–100.0)
MONO ABS: 0.6 10*3/uL (ref 0.1–1.0)
MONOS PCT: 9 %
NEUTROS ABS: 3.6 10*3/uL (ref 1.7–7.7)
Neutrophils Relative %: 52 %
PLATELETS: 365 10*3/uL (ref 150–400)
RBC: 4.52 MIL/uL (ref 3.87–5.11)
RDW: 17 % — ABNORMAL HIGH (ref 11.5–15.5)
WBC: 6.9 10*3/uL (ref 4.0–10.5)

## 2017-11-22 LAB — BASIC METABOLIC PANEL
ANION GAP: 7 (ref 5–15)
BUN: 7 mg/dL (ref 6–20)
CALCIUM: 8.1 mg/dL — AB (ref 8.9–10.3)
CO2: 26 mmol/L (ref 22–32)
Chloride: 105 mmol/L (ref 98–111)
Creatinine, Ser: 0.59 mg/dL (ref 0.44–1.00)
GFR calc Af Amer: >60 mL/min (ref >60)
GFR calc non Af Amer: >60 mL/min (ref >60)
Glucose, Bld: 100 mg/dL — ABNORMAL HIGH (ref 70–99)
POTASSIUM: 3.9 mmol/L (ref 3.5–5.1)
Sodium: 138 mmol/L (ref 135–145)

## 2017-11-22 LAB — POC URINE PREG, ED: PREG TEST UR: NEGATIVE

## 2017-11-22 MED ORDER — METOCLOPRAMIDE HCL 5 MG/ML IJ SOLN
10.0000 mg | Freq: Once | INTRAMUSCULAR | Status: AC
  Administered 2017-11-22: 10 mg via INTRAVENOUS
  Filled 2017-11-22: qty 2

## 2017-11-22 MED ORDER — DIPHENHYDRAMINE HCL 50 MG/ML IJ SOLN
25.0000 mg | Freq: Once | INTRAMUSCULAR | Status: AC
  Administered 2017-11-22: 25 mg via INTRAVENOUS
  Filled 2017-11-22: qty 1

## 2017-11-22 MED ORDER — LACTATED RINGERS IV BOLUS
1000.0000 mL | Freq: Once | INTRAVENOUS | Status: AC
  Administered 2017-11-22: 1000 mL via INTRAVENOUS

## 2017-11-22 MED ORDER — METHYLPREDNISOLONE SODIUM SUCC 125 MG IJ SOLR
125.0000 mg | Freq: Once | INTRAMUSCULAR | Status: AC
  Administered 2017-11-22: 125 mg via INTRAVENOUS
  Filled 2017-11-22: qty 2

## 2017-11-22 NOTE — ED Triage Notes (Signed)
Pt reports headache around her left eye going around left ear.  Has taken Ibuprofen 800mg  with no relief.  Also took Excedrin at 2pm with no relief.

## 2017-11-22 NOTE — ED Provider Notes (Signed)
Emergency Department Provider Note   I have reviewed the triage vital signs and the nursing notes.   HISTORY  Chief Complaint Headache   HPI Kylie Robbins is a 42 y.o. female with medical problems document below the presents the emergency department for headache.  Patient states that she does have headache symptoms but usually good use of BC powders help and they are not help in this 1.  There usually do not last this long and usually associated with her cycle which she is not having right now.  She does not have any fever or other infectious symptoms but does feel that her vision is more blurry than normal.  She states she has no history of blood pressure problems or being on blood pressure medications.  She also states she feels a bit lightheaded.  Light seems to make things worse.  She has some nausea with it as well.  No recent trauma.  No neurologic symptoms. No other associated or modifying symptoms.    Past Medical History:  Diagnosis Date  . Allergy   . Anemia   . Anxiety   . Depression   . GERD (gastroesophageal reflux disease)     Patient Active Problem List   Diagnosis Date Noted  . Vitamin D deficiency 10/17/2016  . GERD without esophagitis 10/16/2016  . Morbid obesity (HCC) 10/16/2016  . Environmental allergies 10/16/2016  . H/O food allergy 10/16/2016  . Tobacco abuse 10/16/2016  . 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 CESAREAN SECTION    . CHOLECYSTECTOMY      Current Outpatient Rx  . Order #: [id number] Class: Historical Med    Allergies Other  Family History  Problem Relation Age of Onset  . Arthritis Mother   . Hyperlipidemia Mother   . Hypertension Mother   . Miscarriages / [place] Mother   . Lupus Mother   . Arthritis Father   . Diabetes Father   . Hyperlipidemia Father   . Hypertension Father   . Alzheimer's disease Maternal Grandmother   . Heart  disease Maternal Grandfather     Social History Social History   Tobacco Use  . Smoking status: Former Smoker    Types: Cigarettes    Start date: 03/10/1998    Last attempt to quit: 09/21/2017    Years since quitting: 0.1  . Smokeless tobacco: Never Used  Substance Use Topics  . Alcohol use: Yes    Comment: social  . Drug use: No    Review of Systems  All other systems negative except as documented in the HPI. All pertinent positives and negatives as reviewed in the HPI. ____________________________________________   PHYSICAL EXAM:  VITAL SIGNS: ED Triage Vitals  Enc Vitals Group     BP 11/22/17 1648 (!) 166/99     Pulse Rate 11/22/17 1648 70     Resp 11/22/17 1648 16     Temp 11/22/17 1648 98.7 F (37.1 C)     Temp Source 11/22/17 1648 Oral     SpO2 11/22/17 1648 100 %     Weight 11/22/17 1648 (!) 315 lb (142.9 kg)     Height 11/22/17 1648 5\' 7"  (1.702 m)    Constitutional: Alert and oriented. Well appearing and in no acute distress. Eyes: Conjunctivae are normal. PERRL. EOMI. No obvious hemorrhage or flattening of optic disc. Head: Atraumatic. Nose: No congestion/rhinnorhea. Mouth/Throat: Mucous membranes are moist.  Oropharynx non-erythematous. Neck: No  stridor.  No meningeal signs.   Cardiovascular: Normal rate, regular rhythm. Good peripheral circulation. Grossly normal heart sounds.   Respiratory: Normal respiratory effort.  No retractions. Lungs CTAB. Gastrointestinal: Soft and nontender. No distention.  Musculoskeletal: No lower extremity tenderness nor edema. No gross deformities of extremities. Neurologic:  Normal speech and language. No gross focal neurologic deficits are appreciated.  Skin:  Skin is warm, dry and intact. No rash noted.  ____________________________________________   LABS (all labs ordered are listed, but only abnormal results are displayed)  Labs Reviewed  CBC WITH DIFFERENTIAL/PLATELET - Abnormal; Notable for the following  components:      Result Value   Hemoglobin 10.7 (*)    HCT 34.7 (*)    MCV 76.8 (*)    MCH 23.7 (*)    RDW 17.0 (*)    All other components within normal limits  BASIC METABOLIC PANEL - Abnormal; Notable for the following components:   Glucose, Bld 100 (*)    Calcium 8.1 (*)    All other components within normal limits  POC URINE PREG, ED   ____________________________________________  EKG   EKG Interpretation  Date/Time:  Sunday November 22 2017 17:44:57 EDT Ventricular Rate:  78 PR Interval:    QRS Duration: 92 QT Interval:  424 QTC Calculation: 483 R Axis:   30 Text Interpretation:  Sinus rhythm Baseline wander in lead(s) III V2 No significant change since last tracing Confirmed by Marily Memos [phone]) on 11/22/2017 6:38:34 PM       ____________________________________________  RADIOLOGY  Ct Head Wo Contrast  Result Date: 11/22/2017 CLINICAL DATA:  Headache EXAM: CT HEAD WITHOUT CONTRAST TECHNIQUE: Contiguous axial images were obtained from the base of the skull through the vertex without intravenous contrast. COMPARISON:  None. FINDINGS: Brain: No evidence of acute infarction, hemorrhage, hydrocephalus, extra-axial collection or mass lesion/mass effect. Vascular: No hyperdense vessel.  No unexpected calcification. Skull: Normal. Negative for fracture or focal lesion. Sinuses/Orbits: No acute finding. Other: None IMPRESSION: Negative non contrasted CT appearance of the brain. Electronically Signed   By: Jasmine Pang M.D.   On: 11/22/2017 18:02    ____________________________________________   PROCEDURES  Procedure(s) performed:   Procedures   ____________________________________________   INITIAL IMPRESSION / ASSESSMENT AND PLAN / ED COURSE  Hypertension and headache.  Difficult to tell which one came first.  Will check for evidence of endorgan damage also treat her headache.  If not able to improve may need blood pressure control.  Headache improved  with cocktail.  Blood pressure improved as well.  Work-up negative.  Patient stable for discharge with neurology follow-up for headaches and PCP follow-up for repeat blood pressure checks.  Pertinent labs & imaging results that were available during my care of the patient were reviewed by me and considered in my medical decision making (see chart for details).  ____________________________________________  FINAL CLINICAL IMPRESSION(S) / ED DIAGNOSES  Final diagnoses:  Hypertension, unspecified type  Acute nonintractable headache, unspecified headache type     MEDICATIONS GIVEN DURING THIS VISIT:  Medications  metoCLOPramide (REGLAN) injection 10 mg (10 mg Intravenous Given 11/22/17 1800)  diphenhydrAMINE (BENADRYL) injection 25 mg (25 mg Intravenous Given 11/22/17 1759)  methylPREDNISolone sodium succinate (SOLU-MEDROL) 125 mg/2 mL injection 125 mg (125 mg Intravenous Given 11/22/17 1759)  lactated ringers bolus 1,000 mL (0 mLs Intravenous Stopped 11/22/17 1948)     NEW OUTPATIENT MEDICATIONS STARTED DURING THIS VISIT:  Discharge Medication List as of 11/22/2017  7:46 PM  Note:  This note was prepared with assistance of Dragon voice recognition software. Occasional wrong-word or sound-a-like substitutions may have occurred due to the inherent limitations of voice recognition software.   Marily MemosMesner, Lenzy Kerschner, MD 11/22/17 2107

## 2018-03-10 ENCOUNTER — Encounter (HOSPITAL_COMMUNITY): Payer: Self-pay | Admitting: Emergency Medicine

## 2018-03-10 ENCOUNTER — Other Ambulatory Visit: Payer: Self-pay

## 2018-03-10 ENCOUNTER — Emergency Department (HOSPITAL_COMMUNITY)
Admission: EM | Admit: 2018-03-10 | Discharge: 2018-03-10 | Disposition: A | Discharge status: Home / Self Care | Payer: BLUE CROSS/BLUE SHIELD | Attending: Emergency Medicine | Admitting: Emergency Medicine

## 2018-03-10 DIAGNOSIS — M545 Low back pain, unspecified: Secondary | ICD-10-CM

## 2018-03-10 DIAGNOSIS — Z87891 Personal history of nicotine dependence: Secondary | ICD-10-CM | POA: Insufficient documentation

## 2018-03-10 DIAGNOSIS — R42 Dizziness and giddiness: Secondary | ICD-10-CM | POA: Insufficient documentation

## 2018-03-10 LAB — CBC WITH DIFFERENTIAL/PLATELET
ABS IMMATURE GRANULOCYTES: 0.03 10*3/uL (ref 0.00–0.07)
BASOS PCT: 0 %
Basophils Absolute: 0 10*3/uL (ref 0.0–0.1)
Eosinophils Absolute: 0.2 10*3/uL (ref 0.0–0.5)
Eosinophils Relative: 2 %
HEMATOCRIT: 38.2 % (ref 36.0–46.0)
Hemoglobin: 11.2 g/dL — ABNORMAL LOW (ref 12.0–15.0)
IMMATURE GRANULOCYTES: 0 %
LYMPHS ABS: 2.4 10*3/uL (ref 0.7–4.0)
Lymphocytes Relative: 28 %
MCH: 22.1 pg — ABNORMAL LOW (ref 26.0–34.0)
MCHC: 29.3 g/dL — ABNORMAL LOW (ref 30.0–36.0)
MCV: 75.3 fL — AB (ref 80.0–100.0)
MONOS PCT: 7 %
Monocytes Absolute: 0.6 10*3/uL (ref 0.1–1.0)
NEUTROS ABS: 5.3 10*3/uL (ref 1.7–7.7)
NEUTROS PCT: 63 %
PLATELETS: 402 10*3/uL — AB (ref 150–400)
RBC: 5.07 MIL/uL (ref 3.87–5.11)
RDW: 18.9 % — ABNORMAL HIGH (ref 11.5–15.5)
WBC: 8.5 10*3/uL (ref 4.0–10.5)
nRBC: 0 % (ref 0.0–0.2)

## 2018-03-10 LAB — URINALYSIS, ROUTINE W REFLEX MICROSCOPIC
BACTERIA UA: NONE SEEN
Bilirubin Urine: NEGATIVE
GLUCOSE, UA: NEGATIVE mg/dL
Ketones, ur: NEGATIVE mg/dL
LEUKOCYTES UA: NEGATIVE
NITRITE: NEGATIVE
Protein, ur: NEGATIVE mg/dL
SPECIFIC GRAVITY, URINE: 1.014 (ref 1.005–1.030)
pH: 5 (ref 5.0–8.0)

## 2018-03-10 LAB — BASIC METABOLIC PANEL
ANION GAP: 7 (ref 5–15)
BUN: 15 mg/dL (ref 6–20)
CO2: 25 mmol/L (ref 22–32)
Calcium: 8.7 mg/dL — ABNORMAL LOW (ref 8.9–10.3)
Chloride: 104 mmol/L (ref 98–111)
Creatinine, Ser: 0.67 mg/dL (ref 0.44–1.00)
GFR calc Af Amer: >60 mL/min (ref >60)
GFR calc non Af Amer: >60 mL/min (ref >60)
GLUCOSE: 115 mg/dL — AB (ref 70–99)
POTASSIUM: 3.8 mmol/L (ref 3.5–5.1)
Sodium: 136 mmol/L (ref 135–145)

## 2018-03-10 LAB — PREGNANCY, URINE: PREG TEST UR: NEGATIVE

## 2018-03-10 LAB — TROPONIN I: Troponin I: <0.03 ng/mL (ref <0.03)

## 2018-03-10 MED ORDER — SODIUM CHLORIDE 0.9 % IV BOLUS
1000.0000 mL | Freq: Once | INTRAVENOUS | Status: AC
  Administered 2018-03-10: 1000 mL via INTRAVENOUS

## 2018-03-10 MED ORDER — KETOROLAC TROMETHAMINE 30 MG/ML IJ SOLN
30.0000 mg | Freq: Once | INTRAMUSCULAR | Status: AC
  Administered 2018-03-10: 30 mg via INTRAVENOUS
  Filled 2018-03-10: qty 1

## 2018-03-10 NOTE — ED Triage Notes (Signed)
Patient reports cold on Sunday, has had laryngitis since. Patient states today she woke up feeling dizzy. Also c/o low back pain that started yesterday.

## 2018-03-10 NOTE — Discharge Instructions (Addendum)
You were seen in the emergency department for some dizziness/lightheadedness and some left-sided low back pain.  You had some blood work and a urinalysis that did not show an obvious cause of your symptoms.  You were improved here with some fluids and some pain medicine.  Please continue Tylenol and ibuprofen for pain and drink plenty of fluids.  Follow-up with your doctor and return if any worsening symptoms.

## 2018-03-10 NOTE — ED Provider Notes (Signed)
[facility] Provider Note   CSN: [id number] Arrival date & time: 03/10/18  1437     History   Chief Complaint Chief Complaint  Patient presents with  . Dizziness    HPI Kylie Robbins is a 43 y.o. female.  She said she started with upper respiratory infection symptoms over the weekend with runny nose sore throat and laryngitis.  Starting last evening she had a little bit of low back pain on the left side that was constant in nature but also with a sharp component to it.  Today she was getting ready for work and she felt dizzy which she describes as both lightheaded and a little bit of room spinning.  She thinks she had a fever over the weekend.  No chest pain no shortness of breath.  She was a little nauseous but no vomiting or diarrhea.  No urinary symptoms.  The history is provided by the patient.  Dizziness  Quality:  Lightheadedness, head spinning and room spinning Severity:  Moderate Onset quality:  Sudden Timing:  Sporadic Progression:  Improving Chronicity:  New Relieved by:  Change in position Worsened by:  Movement Ineffective treatments:  None tried Associated symptoms: nausea   Associated symptoms: no chest pain, no diarrhea, no headaches, no hearing loss, no shortness of breath, no syncope, no tinnitus, no vision changes, no vomiting and no weakness     Past Medical History:  Diagnosis Date  . Allergy   . Anemia   . Anxiety   . Depression   . GERD (gastroesophageal reflux disease)     Patient Active Problem List   Diagnosis Date Noted  . Vitamin D deficiency 10/17/2016  . GERD without esophagitis 10/16/2016  . Morbid obesity (HCC) 10/16/2016  . Environmental allergies 10/16/2016  . H/O food allergy 10/16/2016  . Tobacco abuse 10/16/2016  . 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 CESAREAN SECTION    . CHOLECYSTECTOMY       OB History    Gravida    8   Para  6   Term  6   Preterm      AB  2   Living        SAB  2   TAB      Ectopic      Multiple      Live Births               Home Medications    Prior to Admission medications   Medication Sig Start Date End Date Taking? Authorizing Provider  aspirin-acetaminophen-caffeine (EXCEDRIN MIGRAINE) [id number] MG tablet Take 2 tablets by mouth every 6 (six) hours as needed for headache.    [provider]    Family History Family History  Problem Relation Age of Onset  . Arthritis Mother   . Hyperlipidemia Mother   . Hypertension Mother   . Miscarriages / [place] Mother   . Lupus Mother   . Arthritis Father   . Diabetes Father   . Hyperlipidemia Father   . Hypertension Father   . Alzheimer's disease Maternal Grandmother   . Heart disease Maternal Grandfather     Social History Social History   Tobacco Use  . Smoking status: Former Smoker    Types: Cigarettes    Start date: 03/10/1998    Last attempt to quit: 09/21/2017    Years since quitting: 0.4  . Smokeless tobacco: Never Used  Substance Use Topics  . Alcohol use: Yes    Comment: social  . Drug use: No     Allergies   Other   Review of Systems Review of Systems  Constitutional: Negative for fever.  HENT: Positive for rhinorrhea, sore throat and voice change. Negative for hearing loss and tinnitus.   Eyes: Negative for visual disturbance.  Respiratory: Negative for shortness of breath.   Cardiovascular: Negative for chest pain and syncope.  Gastrointestinal: Positive for nausea. Negative for abdominal pain, diarrhea and vomiting.  Genitourinary: Negative for dysuria.  Musculoskeletal: Positive for back pain. Negative for neck pain.  Skin: Negative for rash.  Neurological: Positive for dizziness. Negative for weakness and headaches.     Physical Exam Updated Vital Signs BP (!) 146/95 (BP Location: Right Arm)   Pulse 79   Temp 98 F (36.7 C) (Oral)   Resp 18   Ht  5\' 6"  (1.676 m)   Wt (!) 158.8 kg   LMP 02/16/2018   SpO2 98%   BMI 56.49 kg/m   Physical Exam Vitals signs and nursing note reviewed.  Constitutional:      General: She is not in acute distress.    Appearance: She is well-developed.  HENT:     Head: Normocephalic and atraumatic.     Right Ear: Tympanic membrane normal.     Left Ear: Tympanic membrane normal.     Nose: Nose normal.     Mouth/Throat:     Mouth: Mucous membranes are moist.     Pharynx: No oropharyngeal exudate.  Eyes:     Extraocular Movements: Extraocular movements intact.     Conjunctiva/sclera: Conjunctivae normal.     Pupils: Pupils are equal, round, and reactive to light.  Neck:     Musculoskeletal: Neck supple.  Cardiovascular:     Rate and Rhythm: Normal rate and regular rhythm.     Heart sounds: No murmur.  Pulmonary:     Effort: Pulmonary effort is normal. No respiratory distress.     Breath sounds: Normal breath sounds.  Abdominal:     Palpations: Abdomen is soft.     Tenderness: There is no abdominal tenderness.  Musculoskeletal: Normal range of motion.        General: No tenderness or signs of injury.  Skin:    General: Skin is warm and dry.     Capillary Refill: Capillary refill takes less than 2 seconds.  Neurological:     General: No focal deficit present.     Mental Status: She is alert and oriented to person, place, and time.     Sensory: No sensory deficit.     Motor: No weakness.     Gait: Gait normal.      ED Treatments / Results  Labs (all labs ordered are listed, but only abnormal results are displayed) Labs Reviewed  BASIC METABOLIC PANEL - Abnormal; Notable for the following components:      Result Value   Glucose, Bld 115 (*)    Calcium 8.7 (*)    All other components within normal limits  CBC WITH DIFFERENTIAL/PLATELET - Abnormal; Notable for the following components:   Hemoglobin 11.2 (*)    MCV 75.3 (*)    MCH 22.1 (*)    MCHC 29.3 (*)    RDW 18.9 (*)     Platelets 402 (*)    All other components within normal limits  URINALYSIS, ROUTINE W REFLEX MICROSCOPIC - Abnormal; Notable for the following components:   Hgb  urine dipstick SMALL (*)    All other components within normal limits  TROPONIN I  PREGNANCY, URINE    EKG EKG Interpretation  Date/Time:  Wednesday March 10 2018 16:44:46 EST Ventricular Rate:  69 PR Interval:    QRS Duration: 92 QT Interval:  391 QTC Calculation: 419 R Axis:   47 Text Interpretation:  Sinus rhythm Abnormal R-wave progression, late transition similar to prior 9/19 Confirmed by Meridee Score [phone]) on 03/10/2018 4:53:53 PM   Radiology No results found.  Procedures Procedures (including critical care time)  Medications Ordered in ED Medications  sodium chloride 0.9 % bolus 1,000 mL (has no administration in time range)     Initial Impression / Assessment and Plan / ED Course  I have reviewed the triage vital signs and the nursing notes.  Pertinent labs & imaging results that were available during my care of the patient were reviewed by me and considered in my medical decision making (see chart for details).  Clinical Course as of Mar 11 916  Wed Mar 10, 2018  6767 Reevaluated patient after IV fluids.  She said she is feeling better with the dizziness and still has a little bit of back pain.  Her lab work is been fairly unremarkable with a normal white count normal renal function clean urine pregnancy test negative normal troponin.  We will try some Toradol.   [MB]  1927 Symptoms improved and she feels well enough to go.   [MB]    Clinical Course User Index [MB] Terrilee Files, MD     Final Clinical Impressions(s) / ED Diagnoses   Final diagnoses:  Dizziness  Acute left-sided low back pain without sciatica    ED Discharge Orders    None       Terrilee Files, MD 03/11/18 [phone]

## 2018-04-12 ENCOUNTER — Telehealth: Payer: Self-pay | Admitting: Family Medicine

## 2018-04-12 NOTE — Telephone Encounter (Signed)
Ok to schedule with Kylie Robbins

## 2018-04-21 ENCOUNTER — Encounter: Payer: Self-pay | Admitting: Family Medicine

## 2018-04-21 ENCOUNTER — Ambulatory Visit (INDEPENDENT_AMBULATORY_CARE_PROVIDER_SITE_OTHER): Payer: BLUE CROSS/BLUE SHIELD | Admitting: Family Medicine

## 2018-04-21 ENCOUNTER — Telehealth (INDEPENDENT_AMBULATORY_CARE_PROVIDER_SITE_OTHER): Payer: Self-pay

## 2018-04-21 DIAGNOSIS — F332 Major depressive disorder, recurrent severe without psychotic features: Secondary | ICD-10-CM | POA: Diagnosis not present

## 2018-04-21 DIAGNOSIS — Z6841 Body Mass Index (BMI) 40.0 and over, adult: Secondary | ICD-10-CM | POA: Diagnosis not present

## 2018-04-21 DIAGNOSIS — R519 Headache, unspecified: Secondary | ICD-10-CM

## 2018-04-21 DIAGNOSIS — Z7189 Other specified counseling: Secondary | ICD-10-CM

## 2018-04-21 DIAGNOSIS — Z0000 Encounter for general adult medical examination without abnormal findings: Secondary | ICD-10-CM

## 2018-04-21 DIAGNOSIS — F410 Panic disorder [episodic paroxysmal anxiety] without agoraphobia: Secondary | ICD-10-CM | POA: Diagnosis not present

## 2018-04-21 DIAGNOSIS — F419 Anxiety disorder, unspecified: Secondary | ICD-10-CM

## 2018-04-21 DIAGNOSIS — R42 Dizziness and giddiness: Secondary | ICD-10-CM

## 2018-04-21 DIAGNOSIS — E559 Vitamin D deficiency, unspecified: Secondary | ICD-10-CM | POA: Diagnosis not present

## 2018-04-21 DIAGNOSIS — R51 Headache: Secondary | ICD-10-CM

## 2018-04-21 MED ORDER — BUPROPION HCL ER (XL) 300 MG PO TB24: 300.0000 mg | ORAL_TABLET | Freq: Every day | ORAL | 2 refills | Status: DC

## 2018-04-21 MED ORDER — ALPRAZOLAM 0.25 MG PO TABS: 0.2500 mg | ORAL_TABLET | Freq: Two times a day (BID) | ORAL | 0 refills | Status: DC | PRN

## 2018-04-21 NOTE — Progress Notes (Signed)
New Patient Office Visit  Subjective:  Patient ID: Kylie Robbins, female    DOB: [birth year]  Age: 43 y.o. MRN: [id number]  CC:  Chief Complaint  Patient presents with  . establish care  . Dizziness    past 2 months, last time was today     HPI Kylie Robbins 43 year old female in today for new visit.  Is not new to the office, was seen by Dr Delton See in the past.  Overall is not very happy and is depressed and tearful today.  Ongoing concerns of some dizziness and questionable intensity and headaches along with depression.  One month and half of dizziness. It is on and off, not daily. Did go to ER on first event- had pull over and have daughter come pick her up. EKG, labs were normal. No findings. Last experience was this morning. Got up and felt dizzy. Sometimes occurs with moving around. Feels like she is spinning, not the room. Hard to get herself together. Kind of a floating feeling. Admits to having anxiety at times with this.   Not sleeping well at this time due to anxiety and depression. Has trouble staying asleep. Thoughts just run. Unable to control these. Extremely tearful daily. Panic attacks 4 days out of the week. Denies caffeine heavy use.  Socially she lives with her 6 children.  2 were over the age of 35.  Which help her with the 4 younger children.  Ex-husband whom she was separated from passed away 2 years ago.  This was the start of her depression.  She has suffered from depression in her past as well.  This brought on significant anxiety and depressive disorder.  Was treated with some medication at one time.  Has stopped since.  She is very interested and therapy.  Very open to starting medication back.  Morbidly obese with a BMI of 63.75.  Has gained 50 pounds since last May.  Reports having low drive to do anything.  But really does want to be able to lose weight and spend more time with her children and not feel as depressed.  Reports not working out and or eating  right.   Past Medical History:  Diagnosis Date  . Allergy   . Anemia   . Anxiety   . Depression   . GERD (gastroesophageal reflux disease)     Past Surgical History:  Procedure Laterality Date  . CESAREAN SECTION    . CHOLECYSTECTOMY      Family History  Problem Relation Age of Onset  . Arthritis Mother   . Hyperlipidemia Mother   . Hypertension Mother   . Miscarriages / [place] Mother   . Lupus Mother   . Arthritis Father   . Diabetes Father   . Hyperlipidemia Father   . Hypertension Father   . Glaucoma Father   . Alzheimer's disease Maternal Grandmother   . Heart disease Maternal Grandfather     Social History   Socioeconomic History  . Marital status: Widowed    Spouse name: Not on file  . Number of children: 6  . Years of education: 71  . Highest education level: 12th grade  Occupational History  . Occupation: customer service    Comment: [employer]  Social Needs  . Financial resource strain: Not hard at all  . Food insecurity:    Worry: Never true    Inability: Never true  . Transportation needs:    Medical: No    Non-medical: No  Tobacco  Use  . Smoking status: Former Smoker    Types: Cigarettes    Start date: 03/10/1998    Last attempt to quit: 09/21/2017    Years since quitting: 0.5  . Smokeless tobacco: Never Used  Substance and Sexual Activity  . Alcohol use: Yes    Comment: social  . Drug use: No  . Sexual activity: Not Currently  Lifestyle  . Physical activity:    Days per week: 0 days    Minutes per session: 0 min  . Stress: Very much  Relationships  . Social connections:    Talks on phone: Once a week    Gets together: Once a week    Attends religious service: More than 4 times per year    Active member of club or organization: No    Attends meetings of clubs or organizations: Never    Relationship status: Widowed  . Intimate partner violence:    Fear of current or ex partner: No    Emotionally abused: No    Physically abused:  No    Forced sexual activity: No  Other Topics Concern  . Not on file  Social History Narrative   Lives with 6 children    ROS Review of Systems  Constitutional: Positive for activity change, appetite change and fatigue.  HENT: Negative.   Eyes: Negative for visual disturbance.  Respiratory: Negative for cough and shortness of breath.   Cardiovascular: Positive for palpitations. Negative for chest pain.       During panic attacks-heart racing  Gastrointestinal: Negative.   Endocrine: Positive for cold intolerance, polydipsia, polyphagia and polyuria. Negative for heat intolerance.       Nails are brittle  Genitourinary: Negative.   Musculoskeletal: Negative.   Skin: Negative.   Neurological: Positive for dizziness and headaches.  Hematological: Does not bruise/bleed easily.  Psychiatric/Behavioral: Positive for decreased concentration and sleep disturbance. The patient is nervous/anxious.   All other systems reviewed and are negative.   Objective:   Today's Vitals: BP (!) 147/93   Pulse 85   Resp 14   Ht 5\' 6"  (1.676 m)   Wt (!) 395 lb (179.2 kg)   LMP 04/17/2018   SpO2 100% Comment: room air  BMI 63.75 kg/m   Physical Exam Vitals signs and nursing note reviewed.  Constitutional:      General: She is in acute distress.     Appearance: Normal appearance. She is obese. She is not ill-appearing.  HENT:     Head: Normocephalic.  Eyes:     Conjunctiva/sclera: Conjunctivae normal.  Neck:     Musculoskeletal: Normal range of motion and neck supple.  Cardiovascular:     Rate and Rhythm: Normal rate and regular rhythm.     Pulses: Normal pulses.  Pulmonary:     Effort: Pulmonary effort is normal.     Breath sounds: Normal breath sounds.  Abdominal:     Palpations: Abdomen is soft.  Musculoskeletal: Normal range of motion.  Skin:    General: Skin is warm and dry.     Capillary Refill: Capillary refill takes less than 2 seconds.  Neurological:     Mental Status:  She is alert and oriented to person, place, and time.  Psychiatric:        Attention and Perception: Attention and perception normal.        Mood and Affect: Mood is anxious and depressed. Affect is tearful.        Behavior: Behavior is withdrawn. Behavior is  cooperative.        Thought Content: Thought content does not include suicidal ideation. Thought content does not include suicidal plan.        Cognition and Memory: Cognition and memory normal.        Judgment: Judgment is not impulsive.     Assessment & Plan:    1. Vitamin D deficiency History of vitamin D deficiency.  Is not currently taking any vitamin D supplements.  Increase PHQ 9 score along with decreased mood will check thyroid and vitamin D levels to make sure these are not the cause if vitamin D is low will start vitamin D supplements.  - Vitamin D (25 hydroxy) - TSH  2. 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  They were very good friends.  Had 2 children together.  She has had a hard time ever since then.  Would really like help to overcome this.  Is interested in talking to psychiatry.  Did a face-to-face office visit with behavioral health in office.  Patient to set up therapy ongoing.  Encouraged to notify through my chart of how everything is going.  Has been on medication in the past before would like to have medication now in the short-term.  Due to having anxiety attacks with this as well has had to call out of work several times in the last several weeks.  Will start on Wellbutrin.  As well as Xanax for the next 2 to 3 weeks.  With understanding that Xanax will  not be continued for long-term.  Encouraged to repeat affirmations discussed during visit and provided on AVS. Encouraged to spend one-on-one time with her children as she feels guilty because she has not been able to do this due to her job hours.  We will see her back in 6 weeks for follow-up.  - buPROPion (WELLBUTRIN XL) 300 MG 24 hr tablet; Take 1 tablet (300 mg total) by mouth daily. Take 1/2 tab daily x 4 days, then take full tab the rest of time.  Dispense: 30 tablet; Refill: 2 - ALPRAZolam (XANAX) 0.25 MG tablet; Take 1 tablet (0.25 mg total) by mouth 2 (two) times daily as needed for anxiety.  Dispense: 30 tablet; Refill: 0  3. Anxiety attack Anxiety attacks are inhibiting her from working.  With worry of possibly losing job.  Is in need of short-term co-therapy therapy.  Started on Xanax for 2 to 3 weeks short-term use as as needed.  With the understanding that the Xanax will not be continued long-term.  Was also started on Wellbutrin.  It is been seen that while starting a antidepressant it is good to sometimes have a short acting medication to help while the first several weeks are being settled in.  Is encouraged to think positively provided with affirmations to repeat to self encouraged to keep therapy up.  - ALPRAZolam (XANAX) 0.25 MG tablet; Take 1 tablet (0.25 mg total) by mouth 2 (two) times daily as needed for anxiety.  Dispense: 30 tablet; Refill: 0  4. Class 3 severe obesity with body mass index (BMI) of 60.0 to 69.9 in adult, unspecified obesity type, unspecified whether serious comorbidity present (  HCC) Has put on around 50 pounds since last year in May.  BMI is currently 63.75.  Weight of 395 pounds.  Patient reports this is the highest she is ever weighed.  Is not happy with the current weight.  Knows that she has no motivation or drive right now to try to lose weight or change diet.  But would like to.  Due to increase in weight gain will check A1c and lipid.  As well as  thyroid to make sure that is not also causing trouble, as she reported having cold sensitivities along with nails being brittle.  - HgB A1c - Lipid Profile - TSH  5. Health care maintenance Is a former smoker has no drive to restart smoking.  Applauded this.  Is in need of tetanus, Pap smear, and flu shot.  Does not want to have these done for a few months.   6. Counseling on health promotion and disease prevention Educated on disease prevention and health promotion.  Maintaining up-to-date healthcare maintenance such as tetanus flu Pap smears, along with eye care and dentist dental care.  Educated on the need to maintain any medications that will be started in the future per labs. Discussed the reason for the labs that were being ordered.   7. Dizziness of unknown etiology Complaints of off and on dizziness was seen in the ER several times nothing was reported as being an issue woke up with it this morning.  Questioning as to whether or not this is related to her inability to sleep and ongoing anxiety.  Will further assess this at follow-up in 6 weeks.  With hopes that medication and therapy will help her.  8. Increased severity of headaches Additionally she has had off and on before cycle increase in intensity of headaches.  Motrin usually does help for these.  Was concerned as to why they were increasing in intensity.  Explained the hormone balance of cycle.  As well as the increase in anxiety.  Which can also lead to having headaches more frequently.    Follow-up: 06/11/2018    Freddy FinnerHannah M Perle Brickhouse, NP

## 2018-04-21 NOTE — Progress Notes (Signed)
[facility] Psychiatric Consultant Case Review  Kylie Robbins is a 43 y.o. year old female with a history of depression, anxiety, GERD. Worsening depression , anxiety in the context of taking care of her six children (age 72-24), loss of her husband (was in separation) in 2018. She was started on bupropion by PCP.  Assessment/Provisional Diagnosis # MDD Continue current medication given it is recently started.   Recommendation  - Consider checking TSH to rule out medical cause - Continue bupropion 300 mg daily. Noted that bupropion can cause adverse reaction of worsening in anxiety; would recommend to monitor any side effect - on xanax 0.25 mg BID. Would recommend judicious use of this medication given its risk of dependence - BH specialist to coach self compassion, behavioral activation  Thank you for your consult. We will continue to follow the patient. Please contact [facility]  for any questions or concerns.   The above treatment considerations and suggestions are based on consultation with the [facility] specialist and/or PCP and a review of information available in the shared registry and the patient's Electronic Health Record (EHR). I have not personally examined the patient. All recommendations should be implemented with consideration of the patient's relevant prior history and current clinical status. Please feel free to call me with any questions about the care of this patient.
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
## 2018-04-21 NOTE — Patient Instructions (Addendum)
.      Thank you for coming into the office today. I appreciate the opportunity to provide you with the care for your health and wellness.   I am extremely proud of you and your motivation to care for yourself and your children. I am honored to help you on this journey.   Today we discussed a lot of items: Depression, anxiety, weight, BP, Dizziness, headaches, vitamin D and various other health promotions for wellness.  Today we set a goal together these included daily affirmations so that you can blossom and grow with a new normal. The start of new medications to help with the depression and anxiety you are feeling. Your homework of finding a place to join for fitness and starting to eat better (more well balanced diet). You goal of walking 3 times a week on the nice days before work.   Affirmations:     I am beautiful.  I am strong.  I am enough, but I know I can be better with my health and wellness.  I am capable of change.  I am a role model for my wonderful children.  I am grateful for ____________ (fill in the blank daily with something new).  I am worthy of life and love.  I am growing into my new normal and self.  I am patient with my process.  I love myself.   We spoke about you talking with Remigio Eisenmenger and God. Embrace your past chapters and revisit them, but do not let them prevent your new chapters in life.  Set time aside for each of your children and do something special they enjoy.   I have ordered your labs today. Please go next door and get them. We will notify you of the results. If you need Vitamin D I will let you know. Please use MyChart to let me know how things are going. I am invested in you and your wellness.   I will see you back in 6 weeks. And at that time we will discuss your progess in person and set up your annual visit.  I hope the medication we started you on is helpful.   It was a pleasure to see you and I look forward to continuing to work together on  your health and well-being. Please do not hesitate to call the office if you need care or have questions about your care.  Have a wonderful day and week.  With Gratitude,  Tereasa Coop, DNP, AGNP-BC

## 2018-04-22 LAB — HEMOGLOBIN A1C
HEMOGLOBIN A1C: 6.2 %{Hb} — AB (ref <5.7)
Mean Plasma Glucose: 131 (calc)
eAG (mmol/L): 7.3 (calc)

## 2018-04-22 LAB — VITAMIN D 25 HYDROXY (VIT D DEFICIENCY, FRACTURES): Vit D, 25-Hydroxy: 8 ng/mL — ABNORMAL LOW (ref 30–100)

## 2018-04-22 LAB — LIPID PANEL
CHOL/HDL RATIO: 5.3 (calc) — AB (ref <5.0)
Cholesterol: 231 mg/dL — ABNORMAL HIGH (ref <200)
HDL: 44 mg/dL — AB (ref >=50)
LDL CHOLESTEROL (CALC): 166 mg/dL — AB
Non-HDL Cholesterol (Calc): 187 mg/dL (calc) — ABNORMAL HIGH (ref <130)
TRIGLYCERIDES: 98 mg/dL (ref <150)

## 2018-04-22 LAB — TSH: TSH: 2.44 m[IU]/L

## 2018-05-04 ENCOUNTER — Telehealth: Payer: Self-pay

## 2018-05-04 NOTE — Telephone Encounter (Signed)
[facility] - Left Message  

## 2018-05-10 ENCOUNTER — Telehealth: Payer: Self-pay | Admitting: Licensed Clinical Social Worker

## 2018-05-10 NOTE — Telephone Encounter (Signed)
[facility] staff followed with client via phone call. Left voicemail.

## 2018-05-19 ENCOUNTER — Telehealth: Payer: Self-pay

## 2018-05-19 NOTE — Telephone Encounter (Signed)
[facility] - Left Message  

## 2018-05-25 ENCOUNTER — Telehealth: Payer: Self-pay

## 2018-05-25 NOTE — Telephone Encounter (Signed)
3rd attempt - [facility]  

## 2018-06-11 ENCOUNTER — Encounter: Payer: Self-pay | Admitting: Family Medicine

## 2018-06-11 ENCOUNTER — Ambulatory Visit (INDEPENDENT_AMBULATORY_CARE_PROVIDER_SITE_OTHER): Payer: BLUE CROSS/BLUE SHIELD | Admitting: Family Medicine

## 2018-06-11 ENCOUNTER — Other Ambulatory Visit: Payer: Self-pay

## 2018-06-11 DIAGNOSIS — F332 Major depressive disorder, recurrent severe without psychotic features: Secondary | ICD-10-CM

## 2018-06-11 DIAGNOSIS — S8391XA Sprain of unspecified site of right knee, initial encounter: Secondary | ICD-10-CM | POA: Diagnosis not present

## 2018-06-11 DIAGNOSIS — E782 Mixed hyperlipidemia: Secondary | ICD-10-CM

## 2018-06-11 DIAGNOSIS — F419 Anxiety disorder, unspecified: Secondary | ICD-10-CM | POA: Diagnosis not present

## 2018-06-11 MED ORDER — BUPROPION HCL ER (XL) 300 MG PO TB24: 300.0000 mg | ORAL_TABLET | Freq: Every day | ORAL | 3 refills | Status: DC

## 2018-06-11 MED ORDER — DICLOFENAC SODIUM 1 % TD GEL: 4.0000 g | Freq: Four times a day (QID) | TRANSDERMAL | 1 refills | Status: AC

## 2018-06-11 NOTE — Patient Instructions (Addendum)
Thank you for allowing your appt to be via telemedicine today. I appreciate the opportunity to provide you with the care for your health and wellness.  Today we discussed:   Your mood, weight, and reviewed your labs, as well as your Right knee.   I am so very PROUD of you for showing up and taking care of yourself and your children. I am HAPPY you are feeling better. Continue the affirmations, walking as you can, and medication.  I have attached some cholesterol information for you to review and start working on. We will follow up with this in 6 weeks.    WASH YOUR HANDS WELL AND FREQUENTLY. AVOID TOUCHING YOUR FACE, UNLESS YOUR HANDS ARE FRESHLY WASHED.  GET FRESH AIR DAILY. STAY HYDRATED WITH WATER.   It was a pleasure to see you and I look forward to continuing to work together on your health and well-being. Please do not hesitate to call the office if you need care or have questions about your care.  Have a wonderful day and week.  With Gratitude,  Tereasa Coop, DNP, AGNP-BC  Hyperlipidemia is excess lipid (FATS) levels in the blood.  The excess lipids in your life places you at higher risk for developing heart disease and heart attacks.  This elevation of lipids or cholesterol weeks and plaque formation in the walls of your major arteries in the body be higher the level of LDL or your bad cholesterol the greater the chance of getting heart disease.  On the other hand the higher the level of the HDL or good cholesterol the lower the risk for heart disease.  Lowering your risk for heart disease involves the following: Diet changes to lower your bad cholesterol and raise your good cholesterol.  Losing weight.  Start by losing 5 to 10 pounds.  Start or increase your physical activity.  Walking is a good exercise to get active.  Regular exercise such as briskly walking for 30 minutes 3 times a week increases your good cholesterol, lowers blood sugar, and promote weight loss and  heart health.  Additionally, it is recommended by the American Heart Association and [employer] of Cardiologist that a degree of goal exercise at least 150 minutes/week accumulative for moderate intensity is brisk walking.  This can be broken up into various durations throughout the week.  For example 30 minutes on lunch break.  15 minutes in the morning.  15 minutes in the evening.  As long as the cumulative total is 150 minutes.  Other ways to modify your risk factors for heart disease include stop smoking it is never too late to stop smoking.  Control your blood pressure.  Can do this by maintaining your medications and taking them as directed.  Taking her medications as directed.  If you are diabetic, it is important that you control your blood sugar levels.  Diet: Follow the dietary approaches to stop hypertension-DASH DIET and to lower-fat for your cholesterol.   A.  Decreased total fat calories and cholesterol.  B.  Decreased total saturated fats, and replaced with monounsaturated fats such as canola oil all of oil or margarine.  C.  Increase fiber with oatmeal, bran, fiber supplements. D.  Increase daily intake of fruits and vegetables.  Be mindful that canned fruits and canned vegetables could have   added sugar and salt.  E.  Tried garlic, soy protein and vitamin C to help lower your LDL cholesterol.  Limit red meats, egg yolks, cheese  and creamy items such as ice cream, creamy sauces, soups and dressings, substituting instead with oil-based salad dressings, broth-based soups, etc.  Avoid fatty/greasy foods and sweets.   Cholesterol  Cholesterol is a fat. Your body needs a small amount of cholesterol. Cholesterol (plaque) may build up in your blood vessels (arteries). That makes you more likely to have a heart attack or stroke. You cannot feel your cholesterol level. Having a blood test is the only way to find out if your level is high. Keep your test results. Work with your doctor  to keep your cholesterol at a good level. What do the results mean?  Total cholesterol is how much cholesterol is in your blood.  LDL is bad cholesterol. This is the type that can build up. Try to have low LDL.  HDL is good cholesterol. It cleans your blood vessels and carries LDL away. Try to have high HDL.  Triglycerides are fat that the body can store or burn for energy. What are good levels of cholesterol?  Total cholesterol below 200.  LDL below 100 is good for people who have health risks. LDL below 70 is good for people who have very high risks.  HDL above 40 is good. It is best to have HDL of 60 or higher.  Triglycerides below 150. How can I lower my cholesterol? Diet Follow your diet program as told by your doctor.  Choose fish, white meat chicken, or [place] that is roasted or baked. Try not to eat red meat, fried foods, sausage, or lunch meats.  Eat lots of fresh fruits and vegetables.  Choose whole grains, beans, pasta, potatoes, and cereals.  Choose olive oil, corn oil, or canola oil. Only use small amounts.  Try not to eat butter, mayonnaise, shortening, or palm kernel oils.  Try not to eat foods with trans fats.  Choose low-fat or nonfat dairy foods. ? Drink skim or nonfat milk. ? Eat low-fat or nonfat yogurt and cheeses. ? Try not to drink whole milk or cream. ? Try not to eat ice cream, egg yolks, or full-fat cheeses.  Healthy desserts include angel food cake, ginger snaps, animal crackers, hard candy, popsicles, and low-fat or nonfat frozen yogurt. Try not to eat pastries, cakes, pies, and cookies.  Exercise Follow your exercise program as told by your doctor.  Be more active. Try gardening, walking, and taking the stairs.  Ask your doctor about ways that you can be more active. Medicine  Take over-the-counter and prescription medicines only as told by your doctor. This information is not intended to replace advice given to you by your health care  provider. Make sure you discuss any questions you have with your health care provider. Document Released: 05/23/2008 Document Revised: 09/26/2015 Document Reviewed: 09/06/2015 Elsevier Interactive Patient Education  2019 Else[employer] Inc[employer]    Acute Knee Pain, Adult Acute knee pain is sudden and may be caused by damage, swelling, or irritation of the muscles and tissues that support your knee. The injury may result from:  A fall.  An injury to your knee from twisting motions.  A hit to the knee.  Infection. Acute knee pain may go away on its own with time and rest. If it does not, your health care provider may order tests to find the cause of the pain. These may include:  Imaging tests, such as an X-ray, MRI, or ultrasound.  Joint aspiration. In this test, fluid is removed from the knee.  Arthroscopy. In this test, a lighted  tube is inserted into the knee and an image is projected onto a TV screen.  Biopsy. In this test, a sample of tissue is removed from the body and studied under a microscope. Follow these instructions at home: Pay attention to any changes in your symptoms. Take these actions to relieve your pain. If you have a knee sleeve or brace:   Wear the sleeve or brace as told by your health care provider. Remove it only as told by your health care provider.  Loosen the sleeve or brace if your toes tingle, become numb, or turn cold and blue.  Keep the sleeve or brace clean.  If the sleeve or brace is not waterproof: ? Do not let it get wet. ? Cover it with a watertight covering when you take a bath or shower. Activity  Rest your knee.  Do not do things that cause pain or make pain worse.  Avoid high-impact activities or exercises, such as running, jumping rope, or doing jumping jacks.  Work with a physical therapist to make a safe exercise program, as recommended by your health care provider. Do exercises as told by your physical therapist. Managing pain,  stiffness, and swelling   If directed, put ice on the knee: ? Put ice in a plastic bag. ? Place a towel between your skin and the bag. ? Leave the ice on for 20 minutes, 2-3 times a day.  If directed, use an elastic bandage to put pressure (compression) on your injured knee. This may control swelling, give support, and help with discomfort. General instructions  Take over-the-counter and prescription medicines only as told by your health care provider.  Raise (elevate) your knee above the level of your heart when you are sitting or lying down.  Sleep with a pillow under your knee.  Do not use any products that contain nicotine or tobacco, such as cigarettes, e-cigarettes, and chewing tobacco. These can delay healing. If you need help quitting, ask your health care provider.  If you are overweight, work with your health care provider and a dietitian to set a weight-loss goal that is healthy and reasonable for you. Extra weight can put pressure on your knee.  Keep all follow-up visits as told by your health care provider. This is important. Contact a health care provider if:  Your knee pain continues, changes, or gets worse.  You have a fever along with knee pain.  Your knee feels warm to the touch.  Your knee buckles or locks up. Get help right away if:  Your knee swells, and the swelling becomes worse.  You cannot move your knee.  You have severe pain in your knee. Summary  Acute knee pain can be caused by a fall, an injury, an infection, or damage, swelling, or irritation of the tissues that support your knee.  Your health care provider may perform tests to find out the cause of the pain.  Pay attention to any changes in your symptoms. Relieve your pain with rest, medicines, light activity, and use of ice.  Get help if your pain continues or becomes worse, your knee swells, or you cannot move your knee. This information is not intended to replace advice given to you by  your health care provider. Make sure you discuss any questions you have with your health care provider. Document Released: 12/22/2006 Document Revised: 08/06/2017 Document Reviewed: 08/06/2017 Elsevier Interactive Patient Education  2019 [employer].

## 2018-06-11 NOTE — Progress Notes (Addendum)
Subjective:     Patient ID: Kylie Robbins, female   DOB: May 13, 1975, 43 y.o.   MRN: [id number]  Location of Patient: Home Location of Provider: Telehealth Consent was obtain for visit to be over via telehealth.  Kylie Robbins presents for Knee Pain; Depression; and Hyperlipidemia  New onset of Right knee pain for 3 weeks back (fall in bathroom due to trying to clean up water after a pip burst). Has hx of a "dislocation" las May 2019. This pain is located in the Right knee, medial side, questionable at joint line. Pain score today during telemed visit is 0/10. At its worse the pain is a level 7/10. It is not disturbing sleep or limiting movement. It is relieved by reduction in walking, ice/heat, and wrapping. It is aggravated by increased activity Modifying factors have included tylenol, ice/heat, and wrapping.  There is no associated lower extremity numbness or weakness. She did not hear or feel a popping sensation when this happened. She reports it is slowly getting better.  Depression: She reports she is feeling so much better. PHQ 9 is a 0, last visit she was 26. She was started on Wellbutrin by me in Feb. She reports this is extremely helpful for her. She is also focusing on affirmations we talked about and was trying to walk more before knee injury.   HLD: Labs demonstrated elevated levels of cholesterol. She is not currently on a statin, is prediabetic per labs. She is working to lose weight, but knee injury is making that harder currently.  Feb 2020: Cholesterol: 231 HDL: 44 LDL: 166 Trig: 98  Vitamin D: Low at 8, started taking Vitamin D that she had at home. Has not notice much of a difference in how she has felt, but thinks this could be helping her mood.  Overall, she reports feeling great. Is very happy with her progress in her mood. Would like more time on getting diet better before starting cholesterol medication.   Past Medical, Surgical, Social History,  Allergies, and Medications have been Reviewed.     Past Medical History:  Diagnosis Date  . Allergy   . Anemia   . Anxiety   . Depression   . GERD (gastroesophageal reflux disease)    Past Surgical History:  Procedure Laterality Date  . CESAREAN SECTION    . CHOLECYSTECTOMY     Social History   Socioeconomic History  . Marital status: Widowed    Spouse name: Not on file  . Number of children: 6  . Years of education: 82  . Highest education level: 12th grade  Occupational History  . Occupation: customer service    Comment: [employer]  Social Needs  . Financial resource strain: Not hard at all  . Food insecurity:    Worry: Never true    Inability: Never true  . Transportation needs:    Medical: No    Non-medical: No  Tobacco Use  . Smoking status: Former Smoker    Types: Cigarettes    Start date: 03/10/1998    Last attempt to quit: 09/21/2017    Years since quitting: 0.7  . Smokeless tobacco: Never Used  Substance and Sexual Activity  . Alcohol use: Yes    Comment: social  . Drug use: No  . Sexual activity: Not Currently  Lifestyle  . Physical activity:    Days per week: 0 days    Minutes per session: 0 min  . Stress: Very much  Relationships  .  Social connections:    Talks on phone: Once a week    Gets together: Once a week    Attends religious service: More than 4 times per year    Active member of club or organization: No    Attends meetings of clubs or organizations: Never    Relationship status: Widowed  . Intimate partner violence:    Fear of current or ex partner: No    Emotionally abused: No    Physically abused: No    Forced sexual activity: No  Other Topics Concern  . Not on file  Social History Narrative   Lives with 6 children:       2020 ages:   Kylie Robbins: 24   Kylie Robbins: 77      Kylie Robbins: 16 ([facility])   Kylie Robbins: 15 ([facility])      Kylie Robbins: 10   Kylie Robbins: 4       Outpatient Encounter Medications as of 06/11/2018  Medication Sig  . ALPRAZolam  (XANAX) 0.25 MG tablet Take 1 tablet (0.25 mg total) by mouth 2 (two) times daily as needed for anxiety.  Marland Kitchen buPROPion (WELLBUTRIN XL) 300 MG 24 hr tablet Take 1 tablet (300 mg total) by mouth daily.  Marland Kitchen ibuprofen (ADVIL,MOTRIN) 200 MG tablet Take 600 mg by mouth every 6 (six) hours as needed for mild pain or moderate pain.  . [DISCONTINUED] buPROPion (WELLBUTRIN XL) 300 MG 24 hr tablet Take 1 tablet (300 mg total) by mouth daily. Take 1/2 tab daily x 4 days, then take full tab the rest of time.  . diclofenac sodium (VOLTAREN) 1 % GEL Apply 4 g topically 4 (four) times daily. As needed for pain   No facility-administered encounter medications on file as of 06/11/2018.    Allergies  Allergen Reactions  . Other Anaphylaxis    Any fish that has a fin . Causes throat swelling.  Can eat canned tuna and salmon.    Review of Systems  Constitutional: Negative for activity change, appetite change, chills and fever.  HENT: Negative.  Negative for congestion, sinus pressure, sinus pain, sneezing and sore throat.   Eyes: Negative.   Respiratory: Negative.  Negative for cough, chest tightness and shortness of breath.   Cardiovascular: Negative.  Negative for chest pain, palpitations and leg swelling.  Gastrointestinal: Negative.   Endocrine: Negative.   Genitourinary: Negative.   Musculoskeletal: Positive for arthralgias.  Skin: Negative.   Allergic/Immunologic: Negative.   Neurological: Negative.  Negative for dizziness and facial asymmetry.  Hematological: Negative.   Psychiatric/Behavioral: Negative.  Negative for dysphoric mood and sleep disturbance. The patient is not nervous/anxious.   All other systems reviewed and are negative.      Objective:     There were no vitals taken for this visit.  Physical Exam Constitutional:      General: She is awake.  Neurological:     Mental Status: She is alert.  Psychiatric:        Attention and Perception: Attention normal.        Mood and  Affect: Mood normal.        Speech: Speech normal.        Behavior: Behavior normal. Behavior is cooperative.        Thought Content: Thought content normal.        Cognition and Memory: Cognition normal.        Judgment: Judgment normal.        Assessment and Plan        1.  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 Mayford KnifeWilliams is feeling much better today on the telemedicine visit she reports that the Wellbutrin is helping her immensely.  Reports that she is eating better, making better food choices, trying to exercise outside of her knee issue right now.  Reports that she would like to continue at this time taking this medication.  Will send in refill, following up in 6 weeks. Patient acknowledged agreement and understanding of the plan.  Reviewed side effects, risks and benefits of medication.     - buPROPion (WELLBUTRIN XL) 300 MG 24 hr tablet; Take 1 tablet (300 mg total) by mouth daily.  Dispense: 90 tablet; Refill: 3  2. Sprain of right knee, unspecified ligament, initial encounter Due to the nature of the knee issue.  And the fact that it is slowly starting to improve.  But it is limiting her in doing her walking.  Will provide her with some Voltaren gel to use topically as needed and she can take Tylenol for breakthrough pain.  Educated on the impact that it takes up to 8 to 12 weeks sometimes for some sprains and/or injuries to start healing that are musculoskeletal in nature.  Follow-up in 6 weeks to check and make sure that the knee is healing better and that she is doing okay.  Would consider physical therapy and/or x-ray at that time possible referral to Ortho if she continues to have ongoing discomfort and pain advised to return if she needs prior to 6-week appointment.   - diclofenac sodium (VOLTAREN) 1 % GEL; Apply 4 g topically 4 (four) times daily. As needed for pain  Dispense: 1 Tube; Refill: 1  I provided 20 minutes of non-face-to-face time  during this encounter.   Return in about 6 weeks (around 07/23/2018) for f/u on diet and wt.   Freddy FinnerHannah M. Georgina Krist, DNP, AGNP-BC [facility] [address], [address] [place], [place] [id number] Office Hours: Mon-Thurs 8 am-5 pm; Fri 8 am-12 pm Office Phone:  [phone]  Office Fax: [phone]

## 2018-06-24 ENCOUNTER — Encounter: Payer: Self-pay | Admitting: Family Medicine

## 2018-06-25 ENCOUNTER — Other Ambulatory Visit: Payer: Self-pay

## 2018-06-25 MED ORDER — VITAMIN D (ERGOCALCIFEROL) 1.25 MG (50000 UNIT) PO CAPS: 50000.0000 [IU] | ORAL_CAPSULE | ORAL | 5 refills | Status: AC

## 2018-06-29 ENCOUNTER — Encounter: Payer: Self-pay | Admitting: Family Medicine

## 2018-06-30 ENCOUNTER — Encounter: Payer: Self-pay | Admitting: Family Medicine

## 2018-06-30 ENCOUNTER — Ambulatory Visit (INDEPENDENT_AMBULATORY_CARE_PROVIDER_SITE_OTHER): Payer: BLUE CROSS/BLUE SHIELD | Admitting: Family Medicine

## 2018-06-30 ENCOUNTER — Other Ambulatory Visit: Payer: Self-pay

## 2018-06-30 DIAGNOSIS — Z9109 Other allergy status, other than to drugs and biological substances: Secondary | ICD-10-CM

## 2018-06-30 DIAGNOSIS — G43909 Migraine, unspecified, not intractable, without status migrainosus: Secondary | ICD-10-CM

## 2018-06-30 MED ORDER — FLUTICASONE PROPIONATE 50 MCG/ACT NA SUSP: 2.0000 | Freq: Every day | NASAL | 2 refills | Status: DC

## 2018-06-30 MED ORDER — SUMATRIPTAN SUCCINATE 50 MG PO TABS: 50.0000 mg | ORAL_TABLET | ORAL | 0 refills | Status: DC | PRN

## 2018-06-30 NOTE — Patient Instructions (Addendum)
Thank you for completing your office visit today via webex. I appreciate the opportunity to provide you with the care for your health and wellness. Today we discussed:  migraine  You are currentinly expereincing a either a bad sinus headache or migraine. Your symtpms are a tad overlapping. That said, I have sent in 2 medications to see if we can get this uncontrol.  I will provide work note once we know when you can return to work.Please let me know if the medications work.  Flonase, this might help if allergy driven. If you have some sinusitis issues occurring we might need to give you an antibiotic, but unsure if that is needed yet.  Imitrex 50mg . You will take one dose of this and in two hours you can repeat the dose. That said, you can not exceed 200 mg of this medication in 24 hours. So you are unable to take more than 4 tablets a day. This medication is used for migraine relief that is not responding to other medications you have tried. It is not intended for the every day headache you might have. You can pair this with some naproxen and see if in combo you feel better. OR can take single as is.  Drink lots of water  And stay safe and healthy.   WASH YOUR HANDS WELL AND FREQUENTLY. AVOID TOUCHING YOUR FACE, UNLESS YOUR HANDS ARE FRESHLY WASHED.  GET FRESH AIR DAILY. STAY HYDRATED WITH WATER.   It was a pleasure to see you and I look forward to continuing to work together on your health and well-being. Please do not hesitate to call the office if you need care or have questions about your care.  Have a wonderful day and week. With Gratitude, Tereasa Coop, DNP, AGNP-BC   Sumatriptan tablets What is this medicine? SUMATRIPTAN (soo ma TRIP tan) is used to treat migraines with or without aura. An aura is a strange feeling or visual disturbance that warns you of an attack. It is not used to prevent migraines. This medicine may be used for other purposes; ask your health care  provider or pharmacist if you have questions. COMMON BRAND NAME(S): Imitrex, Migraine Pack What should I tell my health care provider before I take this medicine? They need to know if you have any of these conditions: -cigarette smoker -circulation problems in fingers and toes -diabetes -heart disease -high blood pressure -high cholesterol -history of irregular heartbeat -history of stroke -kidney disease -liver disease -stomach or intestine problems -an unusual or allergic reaction to sumatriptan, other medicines, foods, dyes, or preservatives -pregnant or trying to get pregnant -breast-feeding How should I use this medicine? Take this medicine by mouth with a glass of water. Follow the directions on the prescription label. Do not take it more often than directed. Talk to your pediatrician regarding the use of this medicine in children. Special care may be needed. Overdosage: If you think you have taken too much of this medicine contact a poison control center or emergency room at once. NOTE: This medicine is only for you. Do not share this medicine with others. What if I miss a dose? This does not apply. This medicine is not for regular use. What may interact with this medicine? Do not take this medicine with any of the following medicines: -certain medicines for migraine headache like almotriptan, eletriptan, frovatriptan, naratriptan, rizatriptan, sumatriptan, zolmitriptan -ergot alkaloids like dihydroergotamine, ergonovine, ergotamine, methylergonovine -MAOIs like Carbex, Eldepryl, Marplan, Nardil, and Parnate This medicine may  also interact with the following medications: -certain medicines for depression, anxiety, or psychotic disorders This list may not describe all possible interactions. Give your health care provider a list of all the medicines, herbs, non-prescription drugs, or dietary supplements you use. Also tell them if you smoke, drink alcohol, or use illegal drugs.  Some items may interact with your medicine. What should I watch for while using this medicine? Visit your healthcare professional for regular checks on your progress. Tell your healthcare professional if your symptoms do not start to get better or if they get worse. You may get drowsy or dizzy. Do not drive, use machinery, or do anything that needs mental alertness until you know how this medicine affects you. Do not stand up or sit up quickly, especially if you are an older patient. This reduces the risk of dizzy or fainting spells. Alcohol may interfere with the effect of this medicine. Tell your healthcare professional right away if you have any change in your eyesight. If you take migraine medicines for 10 or more days a month, your migraines may get worse. Keep a diary of headache days and medicine use. Contact your healthcare professional if your migraine attacks occur more frequently. What side effects may I notice from receiving this medicine? Side effects that you should report to your doctor or health care professional as soon as possible: -allergic reactions like skin rash, itching or hives, swelling of the face, lips, or tongue -changes in vision -chest pain or chest tightness -signs and symptoms of a dangerous change in heartbeat or heart rhythm like chest pain; dizziness; fast, irregular heartbeat; palpitations; feeling faint or lightheaded; falls; breathing problems -signs and symptoms of a stroke like changes in vision; confusion; trouble speaking or understanding; severe headaches; sudden numbness or weakness of the face, arm or leg; trouble walking; dizziness; loss of balance or coordination -signs and symptoms of serotonin syndrome like irritable; confusion; diarrhea; fast or irregular heartbeat; muscle twitching; stiff muscles; trouble walking; sweating; high fever; seizures; chills; vomiting Side effects that usually do not require medical attention (report to your doctor or health  care professional if they continue or are bothersome): -diarrhea -dizziness -drowsiness -dry mouth -headache -nausea, vomiting -pain, tingling, numbness in the hands or feet -stomach pain This list may not describe all possible side effects. Call your doctor for medical advice about side effects. You may report side effects to FDA at 1-800-FDA-1088. Where should I keep my medicine? Keep out of the reach of children. Store at room temperature between 2 and 30 degrees C (36 and 86 degrees F). Throw away any unused medicine after the expiration date. NOTE: This sheet is a summary. It may not cover all possible information. If you have questions about this medicine, talk to your doctor, pharmacist, or health care provider.  2019 Elsevier/Gold Standard (2017-09-08 15:05:37)

## 2018-06-30 NOTE — Progress Notes (Signed)
Virtual Visit via Video Note  I connected with Judyann MunsonElisha J Latouche on 06/30/18 at  8:20 AM EDT by a video enabled telemedicine application and verified that I am speaking with the correct person using two identifiers.   I discussed the limitations of evaluation and management by telemedicine and the availability of in person appointments. The patient expressed understanding and agreed to proceed.  Location of Patient: Home Location of Provider: Telehealth Consent was obtain for visit to be over via telehealth.  History of Present Illness: Ms Mayford KnifeWilliams is a 43 year old patient of mine. She presents to webex visit today secondary to on going headache/migraine. She has missed worked the last several days secondary to it being on going and making it difficult to open her eyes, and listen to people on the phone. She has reported use of aleve and Excedrin migraine without relief.   Onset of this headache was Sunday. It seems to be located around eyes and forehead.  Described as Throbbing, lets up a little bit with Excedrin and Advil but comes right back. Some sounds make it worse like talking on the phone (patient works from home), light seems to make it worse. Excedrin and Advil will relieve it for maybe an hour and a half Current pain is 7/10 level.   She reported some mild right ear pain a week or so ago, but this is not bothering her right now. She also reports taking zyrtec for allergies, but that does not seem to make a difference in her discomfort.   Past Medical, Surgical, Social History, Allergies, and Medications have been Reviewed.    Review of Systems  Constitutional: Negative for chills and fever.  HENT: Positive for ear pain. Negative for congestion and sore throat.   Eyes: Positive for photophobia.  Respiratory: Negative for cough, shortness of breath and wheezing.   Cardiovascular: Negative for chest pain, palpitations and leg swelling.  Gastrointestinal: Positive for nausea.   Genitourinary: Negative.   Musculoskeletal: Negative.   Skin: Negative.   Neurological: Positive for headaches. Negative for dizziness.  Endo/Heme/Allergies: Negative.   Psychiatric/Behavioral: Negative.   All other systems reviewed and are negative.    Observations/Objective: Physical Exam Constitutional:      Appearance: She is well-developed. She is obese.     Comments: Unable to open eyes well and is holding her head during webex  HENT:     Head: Normocephalic and atraumatic.  Eyes:     General: No scleral icterus. Neck:     Musculoskeletal: Normal range of motion.  Pulmonary:     Effort: Pulmonary effort is normal.  Musculoskeletal: Normal range of motion.  Neurological:     Mental Status: She is alert and oriented to person, place, and time.  Psychiatric:        Attention and Perception: Attention normal.        Mood and Affect: Mood normal.        Speech: Speech normal.        Behavior: Behavior normal. Behavior is cooperative.        Cognition and Memory: Cognition and memory normal.        Judgment: Judgment normal.     Comments: Good communication.      Assessment and Plan: 1. Migraine without status migrainosus, not intractable, unspecified migraine type Four days of on going headache with mild relief from NSAID and tylenol use.  Will try Imitrex to break this cycle. This is not a normal pattern for her. If  this does not work or if she starts to have more frequency in headaches of this nature might need to consider a prophylactic treatment like Topamax. Patient acknowledged agreement and understanding of the plan.   Reviewed side effects, risks and benefits of medication.   Educated on not exceed 200 mg in 24 hours.  - SUMAtriptan (IMITREX) 50 MG tablet; Take 1 tablet (50 mg total) by mouth every 2 (two) hours as needed for migraine. May repeat in 2 hours if headache persists or recurs. Do not exceed 200 mg in 24 hours.  Dispense: 10 tablet; Refill: 0  2.  Environmental allergies Possible allergy/sinus component of the headache. She had some sinus and ear pain but denies fever, drainage, or congestion, cough or other symptoms of allergy trouble. Has been taking zrytec as directed without relief. Will try Flonase. If ear pain returns will need to consider in office appt to check ears for effusion of infection.   Reviewed side effects, risks and benefits of medication.   Patient acknowledged agreement and understanding of the plan.    - fluticasone (FLONASE) 50 MCG/ACT nasal spray; Place 2 sprays into both nostrils daily.  Dispense: 16 g; Refill: 2   Follow Up Instructions: AVS printed and mailed   I discussed the assessment and treatment plan with the patient. The patient was provided an opportunity to ask questions and all were answered. The patient agreed with the plan and demonstrated an understanding of the instructions.   The patient was advised to call back or seek an in-person evaluation if the symptoms worsen or if the condition fails to improve as anticipated.  I provided 15 minutes of non-face-to-face time during this encounter.   Freddy Finner, NP

## 2018-08-06 ENCOUNTER — Encounter: Payer: Self-pay | Admitting: Family Medicine

## 2018-11-04 ENCOUNTER — Other Ambulatory Visit (HOSPITAL_COMMUNITY): Payer: Self-pay | Admitting: Family Medicine

## 2018-11-04 ENCOUNTER — Encounter: Payer: Self-pay | Admitting: Family Medicine

## 2018-11-04 ENCOUNTER — Ambulatory Visit: Payer: Medicaid Other | Admitting: Family Medicine

## 2018-11-04 DIAGNOSIS — Z1231 Encounter for screening mammogram for malignant neoplasm of breast: Secondary | ICD-10-CM

## 2018-11-05 ENCOUNTER — Ambulatory Visit (HOSPITAL_COMMUNITY): Admission: RE | Admit: 2018-11-05 | Payer: Medicaid Other | Source: Ambulatory Visit

## 2018-11-05 ENCOUNTER — Encounter (HOSPITAL_COMMUNITY): Payer: Self-pay

## 2018-11-19 ENCOUNTER — Ambulatory Visit: Payer: Medicaid Other | Admitting: Family Medicine

## 2019-06-03 ENCOUNTER — Other Ambulatory Visit: Payer: Self-pay

## 2019-06-03 ENCOUNTER — Encounter (HOSPITAL_COMMUNITY): Payer: Self-pay

## 2019-06-03 ENCOUNTER — Emergency Department (HOSPITAL_COMMUNITY)
Admission: EM | Admit: 2019-06-03 | Discharge: 2019-06-03 | Disposition: A | Discharge status: Home / Self Care | Payer: Medicaid Other | Attending: Emergency Medicine | Admitting: Emergency Medicine

## 2019-06-03 DIAGNOSIS — F419 Anxiety disorder, unspecified: Secondary | ICD-10-CM | POA: Diagnosis present

## 2019-06-03 DIAGNOSIS — R030 Elevated blood-pressure reading, without diagnosis of hypertension: Secondary | ICD-10-CM | POA: Diagnosis not present

## 2019-06-03 DIAGNOSIS — Z87891 Personal history of nicotine dependence: Secondary | ICD-10-CM | POA: Diagnosis not present

## 2019-06-03 DIAGNOSIS — Z79899 Other long term (current) drug therapy: Secondary | ICD-10-CM | POA: Insufficient documentation

## 2019-06-03 DIAGNOSIS — I10 Essential (primary) hypertension: Secondary | ICD-10-CM

## 2019-06-03 MED ORDER — HYDROXYZINE HCL 25 MG PO TABS
25.0000 mg | ORAL_TABLET | Freq: Once | ORAL | Status: AC
  Administered 2019-06-03: 25 mg via ORAL
  Filled 2019-06-03: qty 1

## 2019-06-03 MED ORDER — HYDROXYZINE HCL 25 MG PO TABS: 25.0000 mg | ORAL_TABLET | Freq: Four times a day (QID) | ORAL | 0 refills | Status: AC | PRN

## 2019-06-03 NOTE — ED Triage Notes (Signed)
Pt reports history of anxiety in the past, has taken meds for, but does not take anything now.  Pt reports she took some marijuana/chocolate bar to try to alleve the anxiety.

## 2019-06-03 NOTE — Discharge Instructions (Signed)
Your blood pressure was high today, but that does not necessarily mean that you have high blood pressure.  Please have your blood pressure checked several times over the next 1-2 weeks.  If your blood pressure is persistently high, then you may need to be on medication to control it.

## 2019-06-03 NOTE — ED Provider Notes (Signed)
[facility] EMERGENCY DEPARTMENT Provider Note   CSN: [id number] Arrival date & time: 06/03/19  0035   History Chief Complaint  Patient presents with  . Anxiety    Kylie Robbins is a 44 y.o. female.  The history is provided by the patient.  Anxiety  She has history of anxiety and depression and states that she has had problems with anxiety today which got worse at about 7 PM.  This is the birthday of her baby's father who had died in the past.  There was no other triggers she was aware of.  She tried to control breathing but was unable to do so.  She denies suicidal or homicidal ideation and denies hallucinations.  She tried eating some CBD edibles but without any benefit.  Past Medical History:  Diagnosis Date  . Allergy   . Anemia   . Anxiety   . Depression   . GERD (gastroesophageal reflux disease)     Patient Active Problem List   Diagnosis Date Noted  . Vitamin D deficiency 10/17/2016  . GERD without esophagitis 10/16/2016  . Morbid obesity (HCC) 10/16/2016  . Environmental allergies 10/16/2016  . H/O food allergy 10/16/2016  . Tobacco abuse 10/16/2016  . 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 CESAREAN SECTION    . CHOLECYSTECTOMY       OB History    Gravida  8   Para  6   Term  6   Preterm      AB  2   Living        SAB  2   TAB      Ectopic      Multiple      Live Births              Family History  Problem Relation Age of Onset  . Arthritis Mother   . Hyperlipidemia Mother   . Hypertension Mother   . Miscarriages / [place] Mother   . Lupus Mother   . Arthritis Father   . Diabetes Father   . Hyperlipidemia Father   . Hypertension Father   . Glaucoma Father   . Alzheimer's disease Maternal Grandmother   . Heart disease Maternal Grandfather     Social History   Tobacco Use  . Smoking status: Former Smoker    Types: Cigarettes   Start date: 03/10/1998    Quit date: 09/21/2017    Years since quitting: 1.6  . Smokeless tobacco: Never Used  Substance Use Topics  . Alcohol use: Yes    Comment: social  . Drug use: No    Home Medications Prior to Admission medications   Medication Sig Start Date End Date Taking? Authorizing Provider  ALPRAZolam (XANAX) 0.25 MG tablet Take 1 tablet (0.25 mg total) by mouth 2 (two) times daily as needed for anxiety. 04/21/18   Freddy Finner, NP  buPROPion (WELLBUTRIN XL) 300 MG 24 hr tablet Take 1 tablet (300 mg total) by mouth daily. 06/11/18   Freddy Finner, NP  diclofenac sodium (VOLTAREN) 1 % GEL Apply 4 g topically 4 (four) times daily. As needed for pain 06/11/18   Freddy Finner, NP  fluticasone [facility]) 50 MCG/ACT nasal spray Place 2 sprays into both nostrils daily. 06/30/18   Freddy Finner, NP  ibuprofen (ADVIL,MOTRIN) 200 MG tablet Take 600 mg by mouth every 6 (six) hours as needed for mild pain or moderate  pain.    [provider]  SUMAtriptan (IMITREX) 50 MG tablet Take 1 tablet (50 mg total) by mouth every 2 (two) hours as needed for migraine. May repeat in 2 hours if headache persists or recurs. Do not exceed 200 mg in 24 hours. 06/30/18   Perlie Mayo, NP  Vitamin D, Ergocalciferol, (DRISDOL) 1.25 MG (50000 UT) CAPS capsule Take 1 capsule (50,000 Units total) by mouth every 7 (seven) days. 06/25/18   Fayrene Helper, MD    Allergies    Other  Review of Systems   Review of Systems  All other systems reviewed and are negative.   Physical Exam Updated Vital Signs BP (!) 174/98 (BP Location: Left Arm)   Pulse (!) 110   Temp 98.5 F (36.9 C) (Oral)   Resp 20   Ht 5\' 6"  (1.676 m)   Wt (!) 165.1 kg   LMP 05/09/2019 (Approximate)   SpO2 100%   BMI 58.75 kg/m   Physical Exam Vitals and nursing note reviewed.   44 year old female, somewhat anxious appearing, but in no acute distress. Vital signs are significant for elevated blood pressure and heart  rate. Oxygen saturation is 100%, which is normal. Head is normocephalic and atraumatic. PERRLA, EOMI. Oropharynx is clear. Neck is nontender and supple without adenopathy or JVD. Back is nontender and there is no CVA tenderness. Lungs are clear without rales, wheezes, or rhonchi. Chest is nontender. Heart has regular rate and rhythm without murmur. Abdomen is soft, flat, nontender without masses or hepatosplenomegaly and peristalsis is normoactive. Extremities have no cyanosis or edema, full range of motion is present. Skin is warm and dry without rash. Neurologic: Mental status is normal, cranial nerves are intact, there are no motor or sensory deficits.  ED Results / Procedures / Treatments    EKG EKG Interpretation  Date/Time:  Friday June 03 2019 01:07:21 EDT Ventricular Rate:  113 PR Interval:    QRS Duration: 91 QT Interval:  328 QTC Calculation: 450 R Axis:   56 Text Interpretation: Sinus tachycardia Otherwise within normal limits When compared with ECG of 03/10/2018, No significant change was found Confirmed by Delora Fuel ([id number]) on 06/03/2019 1:24:23 AM   Procedures Procedures  Medications Ordered in ED Medications  hydrOXYzine (ATARAX/VISTARIL) tablet 25 mg (25 mg Oral Given 06/03/19 0136)    ED Course  I have reviewed the triage vital signs and the nursing notes.  MDM Rules/Calculators/A&P Anxiety.  No red flags to suggest more serious pathology.  Elevated blood pressure which will need to be followed as an outpatient.  She is going to be driving herself home, so I am avoiding benzodiazepines.  She is given a dose of hydroxyzine and will be observed in the ED.  She feels much better after above-noted treatment.  She is discharged with prescription for hydroxyzine, advised to have blood pressure rechecked as an outpatient.  Final Clinical Impression(s) / ED Diagnoses Final diagnoses:  Anxiety  Elevated blood pressure reading with diagnosis of hypertension     Rx / DC Orders ED Discharge Orders         Ordered    hydrOXYzine (ATARAX/VISTARIL) 25 MG tablet  Every 6 hours PRN     06/03/19 5284           Delora Fuel, MD [birth year] [phone]

## 2019-11-12 IMAGING — DX DG KNEE COMPLETE 4+V*R*
4 series · 4 of 4 positions shown · non-contrast
Comparison: None.

CLINICAL DATA: Generalized right knee pain after trip and fall this
morning.

EXAM:
RIGHT KNEE - COMPLETE 4+ VIEW

[knee ap (1 of 3)]
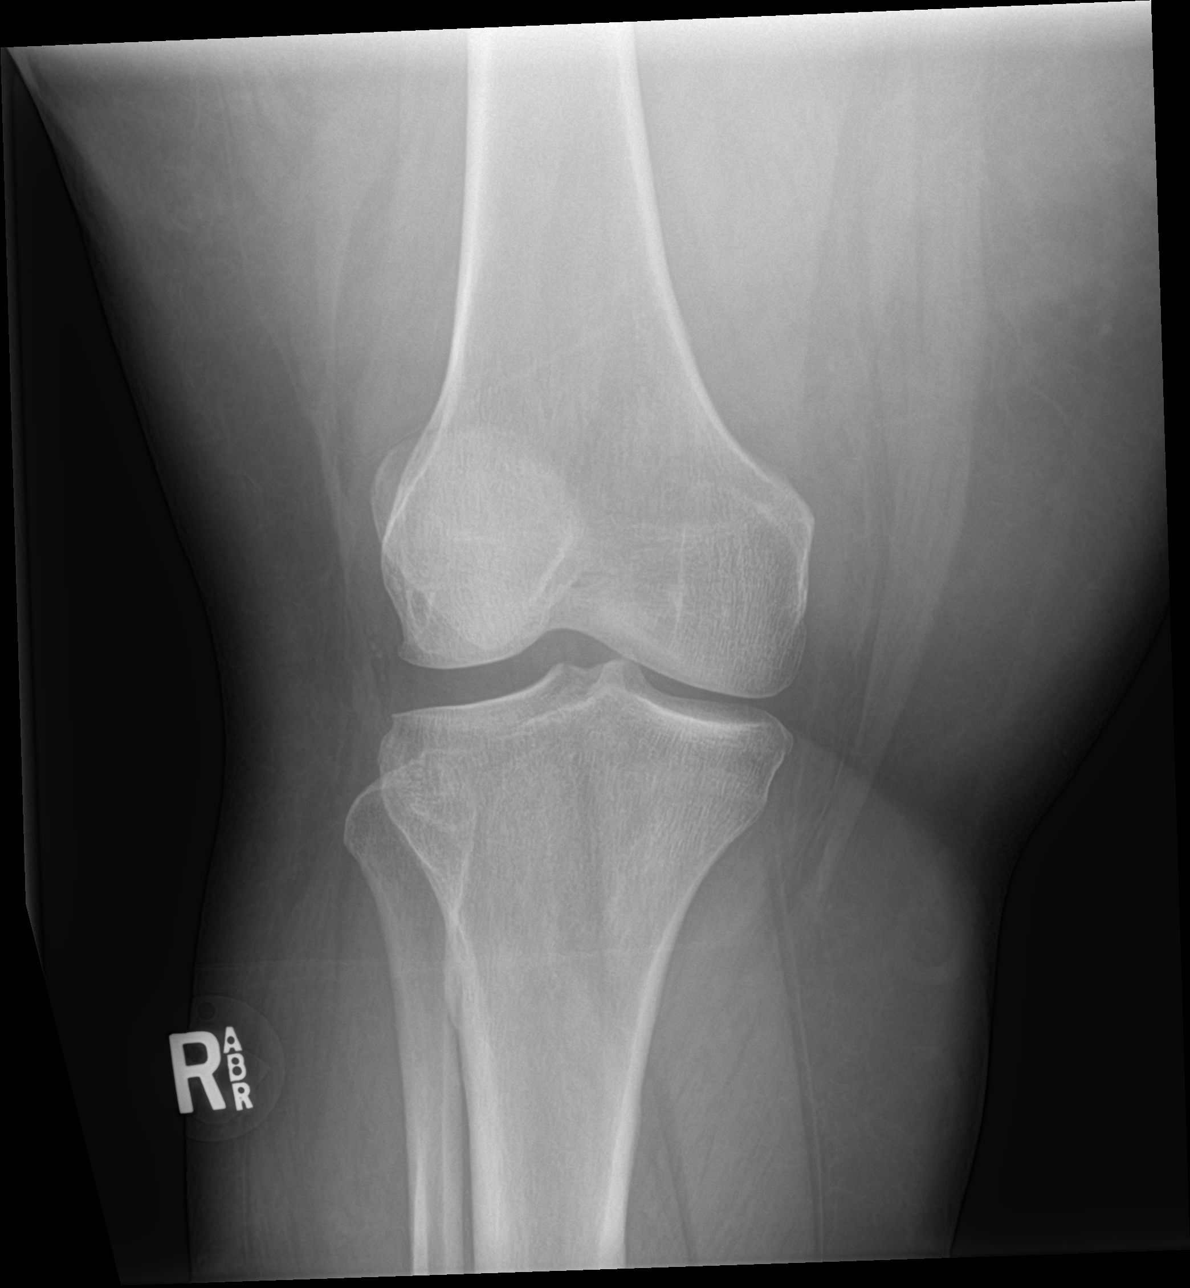

[knee lat]
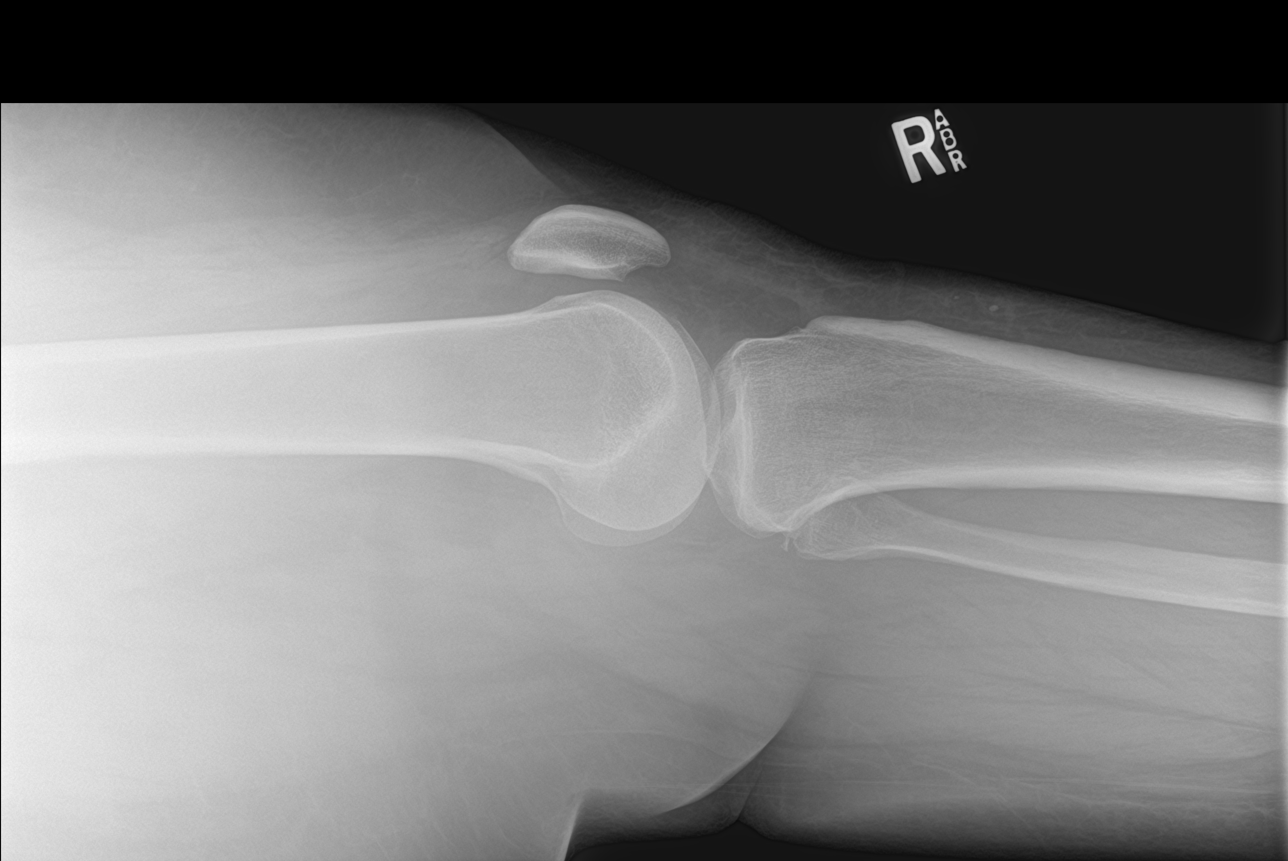

[knee ap (2 of 3)]
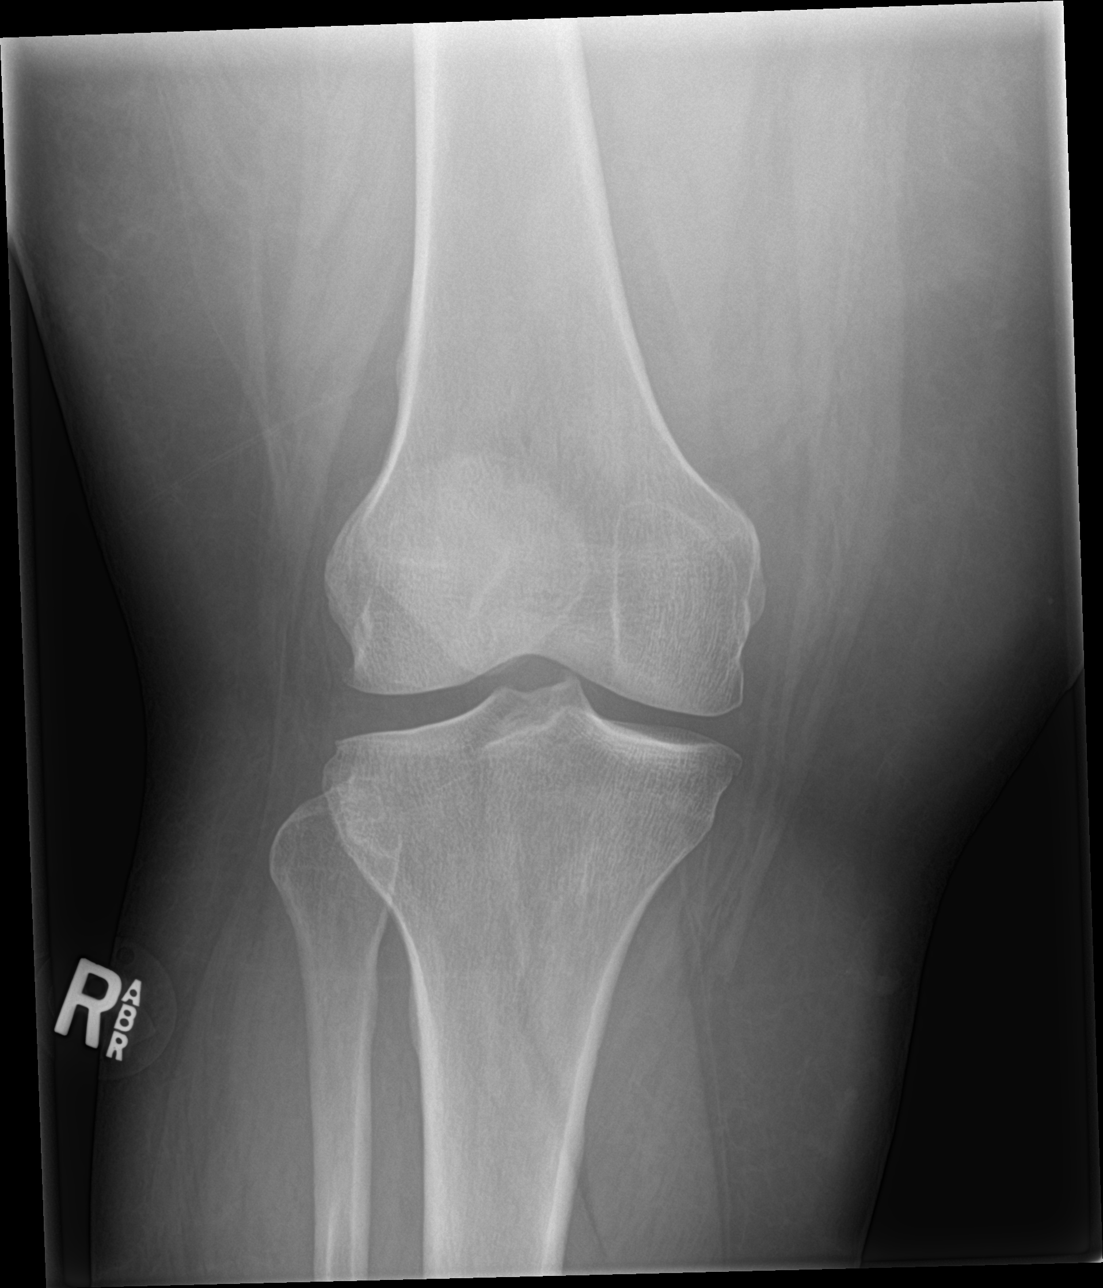

[knee ap (3 of 3)]
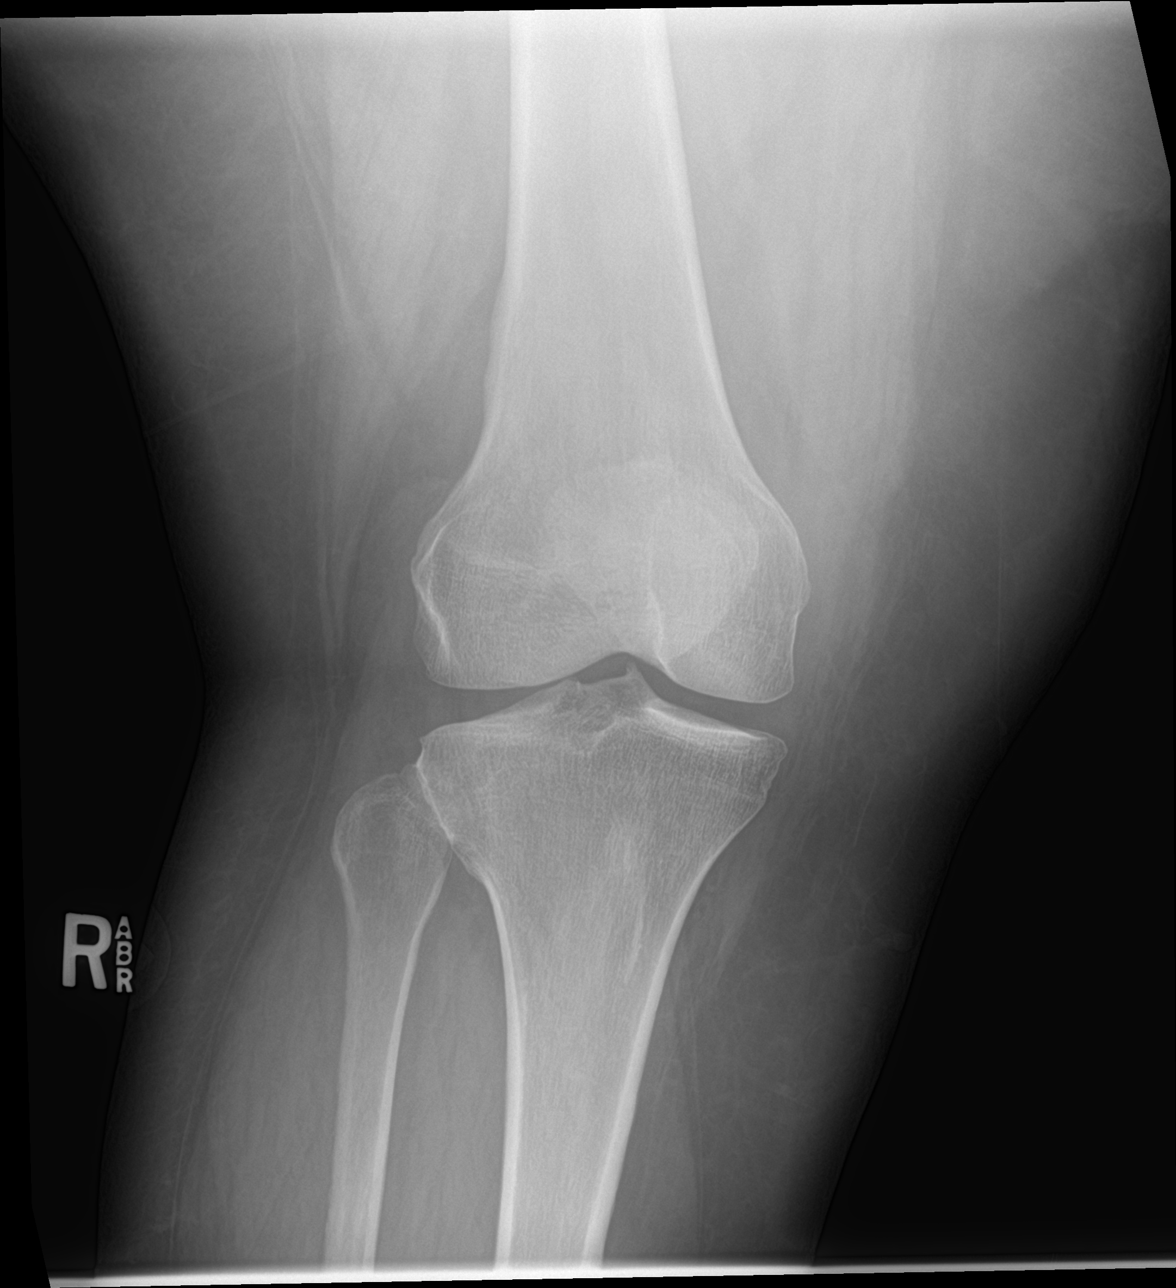

[4 of 4 positions shown; findings below may reference images not displayed]

FINDINGS: Tiny flake avulsion off the tip of the fibula is suggested on the
cross-table lateral view. Joint spaces are maintained. No joint
effusion is seen. Remainder of the study is unremarkable.
IMPRESSION: Tiny avulsion suspected off the tip of the fibula, only seen on the
cross-table lateral projection however. Otherwise negative exam.

## 2019-12-11 ENCOUNTER — Ambulatory Visit
Admission: EM | Admit: 2019-12-11 | Discharge: 2019-12-11 | Disposition: A | Discharge status: Home / Self Care | Payer: Medicaid Other

## 2019-12-11 ENCOUNTER — Other Ambulatory Visit: Payer: Self-pay

## 2019-12-11 DIAGNOSIS — Z1152 Encounter for screening for COVID-19: Secondary | ICD-10-CM

## 2019-12-11 DIAGNOSIS — Z20822 Contact with and (suspected) exposure to covid-19: Secondary | ICD-10-CM

## 2019-12-11 DIAGNOSIS — R0981 Nasal congestion: Secondary | ICD-10-CM

## 2019-12-11 HISTORY — DX: Essential (primary) hypertension: I10

## 2019-12-11 NOTE — Discharge Instructions (Addendum)
COVID testing ordered.  It will take between 5-7 days for test results.  Someone will contact you regarding abnormal results.    In the meantime: You should remain isolated in your home for 10 days from symptom onset AND greater than 72 hours after symptoms resolution (absence of fever without the use of fever-reducing medication and improvement in respiratory symptoms), whichever is longer Get plenty of rest and push fluids Use OTC zyrtec for nasal congestion, runny nose, and/or sore throat Use OTC flonase for nasal congestion and runny nose Use medications daily for symptom relief Use OTC medications like ibuprofen or tylenol as needed fever or pain Follow up with PCP if symptoms persists Call 911 or go to the ED if you have any new or worsening symptoms such as fever, worsening cough, shortness of breath, chest tightness, chest pain, turning blue, changes in mental status, etc..Marland Kitchen

## 2019-12-11 NOTE — ED Provider Notes (Signed)
[facility]   [id number] 12/11/19 Arrival Time: 0931   CC: COVID symptoms  SUBJECTIVE: History from: patient.  Kylie Robbins is a 44 y.o. female who presents with nasal congestion, ears popping, sinus pain, pressure, and sore throat x 1 day.  Denies sick exposure to COVID, flu or strep.  Received both covid vaccines.  Has tried OTC medications without relief.  Denies aggravating factors.  Reports previous symptoms in the past.  Denies hx of COVID infection.  Denies fever, chills, cough, SOB, wheezing, chest pain, nausea, changes in bowel or bladder habits.    ROS: As per HPI.  All other pertinent ROS negative.     Past Medical History:  Diagnosis Date  . Allergy   . Anemia   . Anxiety   . Depression   . GERD (gastroesophageal reflux disease)   . Hypertension    Past Surgical History:  Procedure Laterality Date  . CESAREAN SECTION    . CHOLECYSTECTOMY     Allergies  Allergen Reactions  . Other Anaphylaxis    Any fish that has a fin . Causes throat swelling.  Can eat canned tuna and salmon.   No current facility-administered medications on file prior to encounter.   Current Outpatient Medications on File Prior to Encounter  Medication Sig Dispense Refill  . hydrochlorothiazide (MICROZIDE) 12.5 MG capsule Take by mouth.    . losartan (COZAAR) 25 MG tablet Take by mouth.    . diclofenac sodium (VOLTAREN) 1 % GEL Apply 4 g topically 4 (four) times daily. As needed for pain 1 Tube 1  . escitalopram (LEXAPRO) 20 MG tablet Take 20 mg by mouth daily.    . hydrOXYzine (ATARAX/VISTARIL) 25 MG tablet Take 1 tablet (25 mg total) by mouth every 6 (six) hours as needed for anxiety. 20 tablet 0  . ibuprofen (ADVIL,MOTRIN) 200 MG tablet Take 600 mg by mouth every 6 (six) hours as needed for mild pain or moderate pain.    . Vitamin D, Ergocalciferol, (DRISDOL) 1.25 MG (50000 UT) CAPS capsule Take 1 capsule (50,000 Units total) by mouth every 7 (seven) days. 4 capsule 5  .  [DISCONTINUED] buPROPion (WELLBUTRIN XL) 300 MG 24 hr tablet Take 1 tablet (300 mg total) by mouth daily. 90 tablet 3  . [DISCONTINUED] fluticasone (FLONASE) 50 MCG/ACT nasal spray Place 2 sprays into both nostrils daily. 16 g 2  . [DISCONTINUED] SUMAtriptan (IMITREX) 50 MG tablet Take 1 tablet (50 mg total) by mouth every 2 (two) hours as needed for migraine. May repeat in 2 hours if headache persists or recurs. Do not exceed 200 mg in 24 hours. 10 tablet 0   Social History   Socioeconomic History  . Marital status: Widowed    Spouse name: Not on file  . Number of children: 6  . Years of education: 69  . Highest education level: 12th grade  Occupational History  . Occupation: customer service    Comment: [employer]  Tobacco Use  . Smoking status: Former Smoker    Types: Cigarettes    Start date: 03/10/1998    Quit date: 09/21/2017    Years since quitting: 2.2  . Smokeless tobacco: Never Used  Vaping Use  . Vaping Use: Never used  Substance and Sexual Activity  . Alcohol use: Yes    Comment: social  . Drug use: No  . Sexual activity: Not Currently  Other Topics Concern  . Not on file  Social History Narrative   Lives with 6 children:  2020 ages:   Garlon Hatchet: 24   Michaela: 21      Kyra: 16 ([employer])   N'kya: 15 ([employer])      Tristan: 10   Myles: 4      Social Determinants of Health   Financial Resource Strain:   . Difficulty of Paying Living Expenses: Not on file  Food Insecurity:   . Worried About Programme researcher, broadcasting/film/video in the Last Year: Not on file  . Ran Out of Food in the Last Year: Not on file  Transportation Needs:   . Lack of Transportation (Medical): Not on file  . Lack of Transportation (Non-Medical): Not on file  Physical Activity:   . Days of Exercise per Week: Not on file  . Minutes of Exercise per Session: Not on file  Stress:   . Feeling of Stress : Not on file  Social Connections:   . Frequency of Communication with Friends and Family: Not on file    . Frequency of Social Gatherings with Friends and Family: Not on file  . Attends Religious Services: Not on file  . Active Member of Clubs or Organizations: Not on file  . Attends Banker Meetings: Not on file  . Marital Status: Not on file  Intimate Partner Violence:   . Fear of Current or Ex-Partner: Not on file  . Emotionally Abused: Not on file  . Physically Abused: Not on file  . Sexually Abused: Not on file   Family History  Problem Relation Age of Onset  . Arthritis Mother   . Hyperlipidemia Mother   . Hypertension Mother   . Miscarriages / [place] Mother   . Lupus Mother   . Arthritis Father   . Diabetes Father   . Hyperlipidemia Father   . Hypertension Father   . Glaucoma Father   . Alzheimer's disease Maternal Grandmother   . Heart disease Maternal Grandfather     OBJECTIVE:  Vitals:   12/11/19 0954  BP: 134/85  Pulse: 69  Resp: 18  Temp: 98.1 F (36.7 C)  SpO2: 96%     General appearance: alert; appears fatigued, but nontoxic; speaking in full sentences and tolerating own secretions HEENT: NCAT; Ears: EACs clear, TMs pearly gray; Eyes: PERRL.  EOM grossly intact. Sinuses: nontender; Nose: nares patent without rhinorrhea, Throat: oropharynx clear, tonsils non erythematous or enlarged, uvula midline  Neck: supple without LAD Lungs: unlabored respirations, symmetrical air entry; cough: absent; no respiratory distress; CTAB Heart: regular rate and rhythm.   Skin: warm and dry Psychological: alert and cooperative; normal mood and affect   ASSESSMENT & PLAN:  1. Encounter for screening for COVID-19   2. Nasal congestion   3. Encounter for laboratory testing for COVID-19 virus    COVID testing ordered.  It will take between 5-7 days for test results.  Someone will contact you regarding abnormal results.    In the meantime: You should remain isolated in your home for 10 days from symptom onset AND greater than 72 hours after symptoms  resolution (absence of fever without the use of fever-reducing medication and improvement in respiratory symptoms), whichever is longer Get plenty of rest and push fluids Use OTC zyrtec for nasal congestion, runny nose, and/or sore throat Use OTC flonase for nasal congestion and runny nose Use medications daily for symptom relief Use OTC medications like ibuprofen or tylenol as needed fever or pain Call or go to the ED if you have any new or worsening symptoms such as fever,  worsening cough, shortness of breath, chest tightness, chest pain, turning blue, changes in mental status, etc...   Reviewed expectations re: course of current medical issues. Questions answered. Outlined signs and symptoms indicating need for more acute intervention. Patient verbalized understanding. After Visit Summary given.         Rennis Harding, PA-C 12/11/19 1023

## 2019-12-11 NOTE — ED Triage Notes (Signed)
Pt presents with nasal congestion that began yesterday , with facial pain

## 2019-12-12 LAB — SARS-COV-2, NAA 2 DAY TAT

## 2019-12-12 LAB — NOVEL CORONAVIRUS, NAA: SARS-CoV-2, NAA: NOT DETECTED

## 2020-01-18 ENCOUNTER — Other Ambulatory Visit: Payer: Self-pay | Admitting: Surgery

## 2020-01-18 ENCOUNTER — Other Ambulatory Visit: Payer: Self-pay | Admitting: Obstetrics & Gynecology

## 2020-01-18 ENCOUNTER — Other Ambulatory Visit (HOSPITAL_COMMUNITY): Payer: Self-pay | Admitting: Obstetrics & Gynecology

## 2020-01-18 DIAGNOSIS — Z01818 Encounter for other preprocedural examination: Secondary | ICD-10-CM

## 2020-02-06 ENCOUNTER — Other Ambulatory Visit: Payer: Self-pay

## 2020-02-06 ENCOUNTER — Ambulatory Visit (HOSPITAL_COMMUNITY)
Admission: RE | Admit: 2020-02-06 | Discharge: 2020-02-06 | Disposition: A | Discharge status: Home / Self Care | Payer: Medicaid Other | Source: Ambulatory Visit | Attending: Surgery | Admitting: Surgery

## 2020-02-06 DIAGNOSIS — Z01818 Encounter for other preprocedural examination: Secondary | ICD-10-CM | POA: Insufficient documentation

## 2020-03-08 ENCOUNTER — Other Ambulatory Visit: Payer: Self-pay

## 2020-03-08 ENCOUNTER — Encounter (HOSPITAL_COMMUNITY): Payer: Self-pay | Admitting: Emergency Medicine

## 2020-03-08 ENCOUNTER — Emergency Department (HOSPITAL_COMMUNITY)
Admission: EM | Admit: 2020-03-08 | Discharge: 2020-03-08 | Disposition: A | Discharge status: Home / Self Care | Payer: Medicaid Other | Attending: Emergency Medicine | Admitting: Emergency Medicine

## 2020-03-08 DIAGNOSIS — Z5321 Procedure and treatment not carried out due to patient leaving prior to being seen by health care provider: Secondary | ICD-10-CM | POA: Diagnosis not present

## 2020-03-08 DIAGNOSIS — R070 Pain in throat: Secondary | ICD-10-CM | POA: Insufficient documentation

## 2020-03-08 DIAGNOSIS — R6883 Chills (without fever): Secondary | ICD-10-CM | POA: Diagnosis not present

## 2020-03-08 DIAGNOSIS — R059 Cough, unspecified: Secondary | ICD-10-CM | POA: Insufficient documentation

## 2020-03-08 NOTE — ED Triage Notes (Signed)
Pt c/o sore throat, chills, and cough since yesterday.

## 2020-07-04 ENCOUNTER — Ambulatory Visit
Admission: EM | Admit: 2020-07-04 | Discharge: 2020-07-04 | Disposition: A | Discharge status: Home / Self Care | Payer: Medicaid Other

## 2020-07-04 ENCOUNTER — Other Ambulatory Visit: Payer: Self-pay

## 2020-07-04 ENCOUNTER — Encounter: Payer: Self-pay | Admitting: Emergency Medicine

## 2020-07-04 DIAGNOSIS — R110 Nausea: Secondary | ICD-10-CM

## 2020-07-04 DIAGNOSIS — J069 Acute upper respiratory infection, unspecified: Secondary | ICD-10-CM | POA: Diagnosis not present

## 2020-07-04 DIAGNOSIS — R1013 Epigastric pain: Secondary | ICD-10-CM

## 2020-07-04 MED ORDER — FAMOTIDINE 20 MG PO TABS: 20.0000 mg | ORAL_TABLET | Freq: Two times a day (BID) | ORAL | 0 refills | Status: AC

## 2020-07-04 MED ORDER — PSEUDOEPHEDRINE HCL 60 MG PO TABS: 60.0000 mg | ORAL_TABLET | Freq: Four times a day (QID) | ORAL | 0 refills | Status: DC | PRN

## 2020-07-04 MED ORDER — LEVOCETIRIZINE DIHYDROCHLORIDE 5 MG PO TABS: 5.0000 mg | ORAL_TABLET | Freq: Every evening | ORAL | 0 refills | Status: AC

## 2020-07-04 MED ORDER — ONDANSETRON 4 MG PO TBDP: 4.0000 mg | ORAL_TABLET | Freq: Four times a day (QID) | ORAL | 0 refills | Status: AC

## 2020-07-04 NOTE — ED Provider Notes (Signed)
[facility]    CSN: [id number] Arrival date & time: 07/04/20  1456      History   Chief Complaint No chief complaint on file.   HPI Kylie Robbins is a 45 y.o. female.   HPI   Patient presents with 3 days of congestion, mild facial pressure, nausea, epigastric upset, and headache today. She uses Flonase daily. She has not vomited. She is afebrile. She has not taken any other medications for symptoms today.  Past Medical History:  Diagnosis Date  . Allergy   . Anemia   . Anxiety   . Depression   . GERD (gastroesophageal reflux disease)   . Hypertension     Patient Active Problem List   Diagnosis Date Noted  . Vitamin D deficiency 10/17/2016  . GERD without esophagitis 10/16/2016  . Morbid obesity (HCC) 10/16/2016  . Environmental allergies 10/16/2016  . H/O food allergy 10/16/2016  . Tobacco abuse 10/16/2016  . 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 CESAREAN SECTION    . CHOLECYSTECTOMY      OB History    Gravida  8   Para  6   Term  6   Preterm      AB  2   Living        SAB  2   IAB      Ectopic      Multiple      Live Births               Home Medications    Prior to Admission medications   Medication Sig Start Date End Date Taking? Authorizing Provider  buPROPion (WELLBUTRIN SR) 100 MG 12 hr tablet Take 100 mg by mouth 2 (two) times daily.   Yes [provider]  famotidine (PEPCID) 20 MG tablet Take 1 tablet (20 mg total) by mouth 2 (two) times daily. 07/04/20  Yes Bing Neighbors, FNP  levocetirizine (XYZAL) 5 MG tablet Take 1 tablet (5 mg total) by mouth every evening. 07/04/20  Yes Bing Neighbors, FNP  ondansetron (ZOFRAN ODT) 4 MG disintegrating tablet Take 1 tablet (4 mg total) by mouth every 6 (six) hours. 07/04/20  Yes Bing Neighbors, FNP  phentermine 37.5 MG capsule Take 37.5 mg by mouth every morning.   Yes  [provider]  pseudoephedrine (SUDAFED) 60 MG tablet Take 1 tablet (60 mg total) by mouth every 6 (six) hours as needed for congestion. 07/04/20  Yes Bing Neighbors, FNP  diclofenac sodium (VOLTAREN) 1 % GEL Apply 4 g topically 4 (four) times daily. As needed for pain 06/11/18   Freddy Finner, NP  escitalopram (LEXAPRO) 20 MG tablet Take 20 mg by mouth daily. 09/28/19   [provider]  hydrochlorothiazide (MICROZIDE) 12.5 MG capsule Take by mouth. 10/31/19   [provider]  hydrOXYzine (ATARAX/VISTARIL) 25 MG tablet Take 1 tablet (25 mg total) by mouth every 6 (six) hours as needed for anxiety. 06/03/19   Dione Booze, MD  ibuprofen (ADVIL,MOTRIN) 200 MG tablet Take 600 mg by mouth every 6 (six) hours as needed for mild pain or moderate pain.    [provider]  losartan (COZAAR) 25 MG tablet Take by mouth. 09/26/19   [provider]  Vitamin D, Ergocalciferol, (DRISDOL) 1.25 MG (50000 UT) CAPS capsule Take 1 capsule (50,000 Units total) by mouth every 7 (seven) days. 06/25/18   Syliva Overman  E, MD  fluticasone (FLONASE) 50 MCG/ACT nasal spray Place 2 sprays into both nostrils daily. 06/30/18 12/11/19  Freddy Finner, NP  SUMAtriptan (IMITREX) 50 MG tablet Take 1 tablet (50 mg total) by mouth every 2 (two) hours as needed for migraine. May repeat in 2 hours if headache persists or recurs. Do not exceed 200 mg in 24 hours. 06/30/18 12/11/19  Freddy Finner, NP    Family History Family History  Problem Relation Age of Onset  . Arthritis Mother   . Hyperlipidemia Mother   . Hypertension Mother   . Miscarriages / [place] Mother   . Lupus Mother   . Arthritis Father   . Diabetes Father   . Hyperlipidemia Father   . Hypertension Father   . Glaucoma Father   . Alzheimer's disease Maternal Grandmother   . Heart disease Maternal Grandfather     Social History Social History   Tobacco Use  . Smoking status: Former Smoker    Types:  Cigarettes    Start date: 03/10/1998    Quit date: 09/21/2017    Years since quitting: 2.7  . Smokeless tobacco: Never Used  Vaping Use  . Vaping Use: Never used  Substance Use Topics  . Alcohol use: Yes    Comment: social  . Drug use: No     Allergies   Other   Review of Systems Review of Systems  Pertinent negatives listed in HPI  Physical Exam Triage Vital Signs ED Triage Vitals  Enc Vitals Group     BP 07/04/20 1504 123/83     Pulse Rate 07/04/20 1504 78     Resp 07/04/20 1504 14     Temp 07/04/20 1504 98 F (36.7 C)     Temp src --      SpO2 07/04/20 1504 98 %     Weight --      Height --      Head Circumference --      Peak Flow --      Pain Score 07/04/20 1506 0     Pain Loc --      Pain Edu? --      Excl. in GC? --    No data found.  Updated Vital Signs BP 123/83 (BP Location: Right Arm)   Pulse 78   Temp 98 F (36.7 C)   Resp 14   LMP 06/25/2020   SpO2 98%   Visual Acuity Right Eye Distance:   Left Eye Distance:   Bilateral Distance:    Right Eye Near:   Left Eye Near:    Bilateral Near:     Physical Exam  General Appearance:    Alert,obese, cooperative, no distress  HENT:   Normocephalic, ears normal, nares mucosal edema with congestion present, oropharynx patent   Eyes:    PERRL, conjunctiva/corneas clear, EOM's intact       Lungs:     Clear to auscultation bilaterally, respirations unlabored  Heart:    Regular rate and rhythm  Neurologic:   Awake, alert, oriented x 3. No apparent focal neurological           defect.      UC Treatments / Results  Labs (all labs ordered are listed, but only abnormal results are displayed) Labs Reviewed - No data to display  EKG   Radiology No results found.  Procedures Procedures (including critical care time)  Medications Ordered in UC Medications - No data to display  Initial Impression / Assessment and  Plan / UC Course  I have reviewed the triage vital signs and the nursing  notes.  Pertinent labs & imaging results that were available during my care of the patient were reviewed by me and considered in my medical decision making (see chart for details).    Acute URI viral etiology most likely given exam findings. Abdominal pain in the epigastric region along with nausea without vomiting related to acid reflux most likely related to persistent nasal congestion causing postnasal drainage.  Treatment with NSAIDs Pseudoephedrine for nasal congestion.  Zofran and Pepcid for nausea and epigastric pain.  Follow-up with PCP as needed.  ER if abdominal pain worsens. Final Clinical Impressions(s) / UC Diagnoses   Final diagnoses:  Acute URI  Abdominal pain, epigastric  Nausea without vomiting   Discharge Instructions   None    ED Prescriptions    Medication Sig Dispense Auth. Provider   levocetirizine (XYZAL) 5 MG tablet Take 1 tablet (5 mg total) by mouth every evening. 30 tablet Bing Neighbors, FNP   pseudoephedrine (SUDAFED) 60 MG tablet Take 1 tablet (60 mg total) by mouth every 6 (six) hours as needed for congestion. 30 tablet Bing Neighbors, FNP   ondansetron (ZOFRAN ODT) 4 MG disintegrating tablet Take 1 tablet (4 mg total) by mouth every 6 (six) hours. 20 tablet Bing Neighbors, FNP   famotidine (PEPCID) 20 MG tablet Take 1 tablet (20 mg total) by mouth 2 (two) times daily. 30 tablet Bing Neighbors, FNP     PDMP not reviewed this encounter.   Bing Neighbors, FNP 07/04/20 1538

## 2020-07-04 NOTE — ED Triage Notes (Signed)
Congestion yesterday.  Nausea started today and lightheadedness.

## 2020-09-30 ENCOUNTER — Other Ambulatory Visit: Payer: Self-pay

## 2020-09-30 ENCOUNTER — Encounter (HOSPITAL_COMMUNITY): Payer: Self-pay

## 2020-09-30 ENCOUNTER — Emergency Department (HOSPITAL_COMMUNITY)
Admission: EM | Admit: 2020-09-30 | Discharge: 2020-09-30 | Disposition: A | Discharge status: Home / Self Care | Payer: BC Managed Care – PPO | Attending: Emergency Medicine | Admitting: Emergency Medicine

## 2020-09-30 ENCOUNTER — Emergency Department (HOSPITAL_COMMUNITY): Payer: BC Managed Care – PPO

## 2020-09-30 DIAGNOSIS — Z79899 Other long term (current) drug therapy: Secondary | ICD-10-CM | POA: Diagnosis not present

## 2020-09-30 DIAGNOSIS — I10 Essential (primary) hypertension: Secondary | ICD-10-CM | POA: Insufficient documentation

## 2020-09-30 DIAGNOSIS — E119 Type 2 diabetes mellitus without complications: Secondary | ICD-10-CM | POA: Diagnosis not present

## 2020-09-30 DIAGNOSIS — Z20822 Contact with and (suspected) exposure to covid-19: Secondary | ICD-10-CM | POA: Insufficient documentation

## 2020-09-30 DIAGNOSIS — M25511 Pain in right shoulder: Secondary | ICD-10-CM | POA: Diagnosis not present

## 2020-09-30 DIAGNOSIS — M25512 Pain in left shoulder: Secondary | ICD-10-CM | POA: Diagnosis not present

## 2020-09-30 DIAGNOSIS — Z87891 Personal history of nicotine dependence: Secondary | ICD-10-CM | POA: Insufficient documentation

## 2020-09-30 DIAGNOSIS — R0789 Other chest pain: Secondary | ICD-10-CM | POA: Diagnosis not present

## 2020-09-30 DIAGNOSIS — R42 Dizziness and giddiness: Secondary | ICD-10-CM | POA: Diagnosis present

## 2020-09-30 DIAGNOSIS — R0602 Shortness of breath: Secondary | ICD-10-CM | POA: Diagnosis not present

## 2020-09-30 DIAGNOSIS — E860 Dehydration: Secondary | ICD-10-CM

## 2020-09-30 DIAGNOSIS — R079 Chest pain, unspecified: Secondary | ICD-10-CM

## 2020-09-30 HISTORY — DX: Type 2 diabetes mellitus without complications: E11.9

## 2020-09-30 LAB — CBC WITH DIFFERENTIAL/PLATELET
Abs Immature Granulocytes: 0.03 10*3/uL (ref 0.00–0.07)
Basophils Absolute: 0.1 10*3/uL (ref 0.0–0.1)
Basophils Relative: 1 %
Eosinophils Absolute: 0.2 10*3/uL (ref 0.0–0.5)
Eosinophils Relative: 2 %
HCT: 38.2 % (ref 36.0–46.0)
Hemoglobin: 11.9 g/dL — ABNORMAL LOW (ref 12.0–15.0)
Immature Granulocytes: 0 %
Lymphocytes Relative: 35 %
Lymphs Abs: 3.7 10*3/uL (ref 0.7–4.0)
MCH: 22.8 pg — ABNORMAL LOW (ref 26.0–34.0)
MCHC: 31.2 g/dL (ref 30.0–36.0)
MCV: 73.3 fL — ABNORMAL LOW (ref 80.0–100.0)
Monocytes Absolute: 0.8 10*3/uL (ref 0.1–1.0)
Monocytes Relative: 8 %
Neutro Abs: 5.6 10*3/uL (ref 1.7–7.7)
Neutrophils Relative %: 54 %
Platelets: 426 10*3/uL — ABNORMAL HIGH (ref 150–400)
RBC: 5.21 MIL/uL — ABNORMAL HIGH (ref 3.87–5.11)
RDW: 20.3 % — ABNORMAL HIGH (ref 11.5–15.5)
WBC: 10.3 10*3/uL (ref 4.0–10.5)
nRBC: 0 % (ref 0.0–0.2)

## 2020-09-30 LAB — COMPREHENSIVE METABOLIC PANEL
ALT: 23 U/L (ref 0–44)
AST: 19 U/L (ref 15–41)
Albumin: 3.7 g/dL (ref 3.5–5.0)
Alkaline Phosphatase: 96 U/L (ref 38–126)
Anion gap: 8 (ref 5–15)
BUN: 14 mg/dL (ref 6–20)
CO2: 27 mmol/L (ref 22–32)
Calcium: 9.1 mg/dL (ref 8.9–10.3)
Chloride: 101 mmol/L (ref 98–111)
Creatinine, Ser: 0.81 mg/dL (ref 0.44–1.00)
GFR, Estimated: >60 mL/min (ref >60)
Glucose, Bld: 96 mg/dL (ref 70–99)
Potassium: 4.2 mmol/L (ref 3.5–5.1)
Sodium: 136 mmol/L (ref 135–145)
Total Bilirubin: 0.4 mg/dL (ref 0.3–1.2)
Total Protein: 7.6 g/dL (ref 6.5–8.1)

## 2020-09-30 LAB — RESP PANEL BY RT-PCR (FLU A&B, COVID) ARPGX2
Influenza A by PCR: NEGATIVE
Influenza B by PCR: NEGATIVE
SARS Coronavirus 2 by RT PCR: NEGATIVE

## 2020-09-30 LAB — TROPONIN I (HIGH SENSITIVITY): Troponin I (High Sensitivity): 3 ng/L (ref <18)

## 2020-09-30 NOTE — ED Provider Notes (Signed)
[facility]-EMERGENCY DEPT Provider Note   CSN: [id number] Arrival date & time: 09/30/20  1449     History Chief Complaint  Patient presents with   Shortness of Breath   Shoulder Pain    KARALYNN COTTONE is a 45 y.o. female.  Patient is a 45 year old female with a history of diabetes, GERD, hypertension, depression and anxiety who is presenting today due to dizziness upon standing, shortness of breath and chest pressure.  She reports on Friday she had vomiting and diarrhea numerous times throughout the day but when she woke up yesterday she was feeling better.  She took it easy yesterday and then went back to work today.  Around 11:00 she had been feeling fine until she stood up and started feeling very dizzy and lightheaded and then developed a pressure sensation in the center of her chest that went into both shoulders that made her feel short of breath.  She thought she may have been having a panic attack and she took hydroxyzine which did improve some of her symptoms but she still has some mild heaviness in her left shoulder but no centralized chest pain at this time.  She also still feels slightly short of breath and every time she stands up she is still feeling lightheaded.  She has had something to eat today.  She is taken all of her medications including her blood pressure medicine that are prescribed this morning.  She takes her meds faithfully and has not missed any doses.  She denies alcohol, drugs or tobacco use.  No significant family history of MI.  No fever, cough, congestion, abdominal pain, urinary symptoms.  The history is provided by the patient.  Shortness of Breath Shoulder Pain     Past Medical History:  Diagnosis Date   Allergy    Anemia    Anxiety    Depression    Diabetes mellitus without complication (HCC)    GERD (gastroesophageal reflux disease)    Hypertension     Patient Active Problem List   Diagnosis Date Noted   Vitamin D  deficiency 10/17/2016   GERD without esophagitis 10/16/2016   Morbid obesity (HCC) 10/16/2016   Environmental allergies 10/16/2016   H/O food allergy 10/16/2016   Tobacco abuse 10/16/2016   Situational mixed anxiety and depressive disorder 10/16/2016    Past Surgical History:  Procedure Laterality Date   CESAREAN SECTION     CHOLECYSTECTOMY     TUBAL LIGATION       OB History     Gravida  8   Para  6   Term  6   Preterm      AB  2   Living         SAB  2   IAB      Ectopic      Multiple      Live Births              Family History  Problem Relation Age of Onset   Arthritis Mother    Hyperlipidemia Mother    Hypertension Mother    Miscarriages / [place] Mother    Lupus Mother    Arthritis Father    Diabetes Father    Hyperlipidemia Father    Hypertension Father    Glaucoma Father    Alzheimer's disease Maternal Grandmother    Heart disease Maternal Grandfather     Social History   Tobacco Use   Smoking status: Former  Types: Cigarettes    Start date: 03/10/1998    Quit date: 09/21/2017    Years since quitting: 3.0   Smokeless tobacco: Never  Vaping Use   Vaping Use: Never used  Substance Use Topics   Alcohol use: Yes    Comment: social   Drug use: No    Home Medications Prior to Admission medications   Medication Sig Start Date End Date Taking? Authorizing Provider  buPROPion (WELLBUTRIN SR) 100 MG 12 hr tablet Take 100 mg by mouth 2 (two) times daily.    [provider]  diclofenac sodium (VOLTAREN) 1 % GEL Apply 4 g topically 4 (four) times daily. As needed for pain 06/11/18   Freddy FinnerMills, Hannah M, NP  escitalopram (LEXAPRO) 20 MG tablet Take 20 mg by mouth daily. 09/28/19   [provider]  famotidine (PEPCID) 20 MG tablet Take 1 tablet (20 mg total) by mouth 2 (two) times daily. 07/04/20   Bing NeighborsHarris, Kimberly S, FNP  hydrochlorothiazide (MICROZIDE) 12.5 MG capsule Take by mouth. 10/31/19   [provider]   hydrOXYzine (ATARAX/VISTARIL) 25 MG tablet Take 1 tablet (25 mg total) by mouth every 6 (six) hours as needed for anxiety. 06/03/19   Dione BoozeGlick, David, MD  ibuprofen (ADVIL,MOTRIN) 200 MG tablet Take 600 mg by mouth every 6 (six) hours as needed for mild pain or moderate pain.    [provider]  levocetirizine (XYZAL) 5 MG tablet Take 1 tablet (5 mg total) by mouth every evening. 07/04/20   Bing NeighborsHarris, Kimberly S, FNP  losartan (COZAAR) 25 MG tablet Take by mouth. 09/26/19   [provider]  ondansetron (ZOFRAN ODT) 4 MG disintegrating tablet Take 1 tablet (4 mg total) by mouth every 6 (six) hours. 07/04/20   Bing NeighborsHarris, Kimberly S, FNP  phentermine 37.5 MG capsule Take 37.5 mg by mouth every morning.    [provider]  pseudoephedrine (SUDAFED) 60 MG tablet Take 1 tablet (60 mg total) by mouth every 6 (six) hours as needed for congestion. 07/04/20   Bing NeighborsHarris, Kimberly S, FNP  Vitamin D, Ergocalciferol, (DRISDOL) 1.25 MG (50000 UT) CAPS capsule Take 1 capsule (50,000 Units total) by mouth every 7 (seven) days. 06/25/18   Kerri PerchesSimpson, Margaret E, MD  fluticasone (FLONASE) 50 MCG/ACT nasal spray Place 2 sprays into both nostrils daily. 06/30/18 12/11/19  Freddy FinnerMills, Hannah M, NP  SUMAtriptan (IMITREX) 50 MG tablet Take 1 tablet (50 mg total) by mouth every 2 (two) hours as needed for migraine. May repeat in 2 hours if headache persists or recurs. Do not exceed 200 mg in 24 hours. 06/30/18 12/11/19  Freddy FinnerMills, Hannah M, NP    Allergies    Other  Review of Systems   Review of Systems  Respiratory:  Positive for shortness of breath.   All other systems reviewed and are negative.  Physical Exam Updated Vital Signs BP (!) 138/96   Pulse 78   Temp 98.5 F (36.9 C) (Oral)   Resp 19   LMP 08/13/2020   SpO2 98%   Physical Exam Vitals and nursing note reviewed.  Constitutional:      General: She is not in acute distress.    Appearance: She is well-developed.     Comments: Morbidly obese  HENT:      Head: Normocephalic and atraumatic.     Nose: Nose normal.     Mouth/Throat:     Mouth: Mucous membranes are moist.  Eyes:     Pupils: Pupils are equal, round, and reactive to  light.  Cardiovascular:     Rate and Rhythm: Normal rate and regular rhythm.     Pulses: Normal pulses.     Heart sounds: Normal heart sounds. No murmur heard.   No friction rub.  Pulmonary:     Effort: Pulmonary effort is normal.     Breath sounds: Normal breath sounds. No decreased breath sounds, wheezing or rales.     Comments: Mild tenderness with palpation of the chest and shoulder Chest:     Chest wall: Tenderness present.  Abdominal:     General: Bowel sounds are normal. There is no distension.     Palpations: Abdomen is soft.     Tenderness: There is no abdominal tenderness. There is no guarding or rebound.  Musculoskeletal:        General: No tenderness. Normal range of motion.     Right lower leg: No edema.     Left lower leg: No edema.     Comments: No edema.  No unilateral calf tenderness or swelling  Skin:    General: Skin is warm and dry.     Findings: No rash.  Neurological:     General: No focal deficit present.     Mental Status: She is alert and oriented to person, place, and time. Mental status is at baseline.     Cranial Nerves: No cranial nerve deficit.  Psychiatric:        Mood and Affect: Mood normal.        Behavior: Behavior normal.    ED Results / Procedures / Treatments   Labs (all labs ordered are listed, but only abnormal results are displayed) Labs Reviewed  CBC WITH DIFFERENTIAL/PLATELET - Abnormal; Notable for the following components:      Result Value   RBC 5.21 (*)    Hemoglobin 11.9 (*)    MCV 73.3 (*)    MCH 22.8 (*)    RDW 20.3 (*)    Platelets 426 (*)    All other components within normal limits  RESP PANEL BY RT-PCR (FLU A&B, COVID) [id number]  COMPREHENSIVE METABOLIC PANEL  TROPONIN I (HIGH SENSITIVITY)    EKG EKG Interpretation  Date/Time:  Sunday  September 30 2020 14:57:18 EDT Ventricular Rate:  77 PR Interval:  145 QRS Duration: 93 QT Interval:  396 QTC Calculation: 449 R Axis:   11 Text Interpretation: Sinus rhythm Baseline wander in lead(s) II III aVF 12 Lead; Mason-Likar No significant change since last tracing Confirmed by Gwyneth Sprout ([id number]) on 09/30/2020 3:42:23 PM  Radiology DG Chest 2 View  Result Date: 09/30/2020 CLINICAL DATA:  Shortness of breath.  Bilateral shoulder pain. EXAM: CHEST - 2 VIEW COMPARISON:  June 13, 2016 FINDINGS: The heart size and mediastinal contours are within normal limits. Both lungs are clear. The visualized skeletal structures are unremarkable. IMPRESSION: No active cardiopulmonary disease. Electronically Signed   By: Gerome Sam III M.D   On: 09/30/2020 15:24    Procedures Procedures   Medications Ordered in ED Medications - No data to display  ED Course  I have reviewed the triage vital signs and the nursing notes.  Pertinent labs & imaging results that were available during my care of the patient were reviewed by me and considered in my medical decision making (see chart for details).    MDM Rules/Calculators/A&P                           Patient is a  45 year old female presenting today with complaints of dizziness upon standing which then progressed into chest discomfort that radiates into her bilateral shoulders that has made her feel short of breath.  This occurred about 11 AM this morning.  Symptoms are improving after she took her to hydroxyzine but she still does not feel 100% normal.  She did have some nausea and vomiting on Friday but yesterday was feeling more normal until today.  Started metformin this week for the first time but no other medication changes.  She is not take OCPs or have increased risk for clots.  PERC negative.  No significant family history of MI.  Patient is low risk with heart score of 3.  EKG without acute findings today and chest x-ray within normal  limits.  Vital signs reassuring with normal oxygen saturation and respiratory rate.  No acute lung findings at this time.  Low suspicion for PE, dissection, pneumonia.  Low suspicion for ACS but will check a troponin as patient symptoms have been consistent for greater than 3 hours feel that 1 troponin will be sufficient.  Concern for possible dehydration with the dizziness upon standing and recent vomiting and diarrhea which may be a result of her recent metformin.  However patient also could possibly have COVID.  She has no abdominal findings today concerning for appendicitis, diverticulitis, cholecystitis or pancreatitis.  She has no urinary symptoms.  Labs are pending.  4:40 PM Patient CBC with normal hemoglobin, normal renal function and electrolytes and troponin of 3 which with pain that is been going on greater than 3 hours prior to the lab being drawn low suspicion for ACS at this time.  COVID is negative.  Patient is currently tolerating p.o.'s.  Patient's nausea and vomiting from acute few days ago could be related to metformin.  Feel that she is otherwise reasonable for discharge and can p.o. hydrate and return for any worsening symptoms.  MDM   Amount and/or Complexity of Data Reviewed Clinical lab tests: ordered and reviewed Tests in the radiology section of CPT: ordered and reviewed Tests in the medicine section of CPT: ordered and reviewed Independent visualization of images, tracings, or specimens: yes  Patient Progress Patient progress: stable   Final Clinical Impression(s) / ED Diagnoses Final diagnoses:  Dehydration  Nonspecific chest pain    Rx / DC Orders ED Discharge Orders     None        Gwyneth Sprout, MD 09/30/20 1806

## 2020-09-30 NOTE — ED Triage Notes (Signed)
Pt c/o bilateral shoulder pain, dizziness, blurred vision, and SOB starting this afternoon.  Pain score 6/10.  Pt reports she thought it was an anxiety attack and took her medication w/o relief.

## 2020-09-30 NOTE — Discharge Instructions (Addendum)
All the blood work looks normal today.  No signs of blood clots, heart attack, infection or kidney problems.  Make sure you are drinking plenty of fluids.  Rest the rest of today and tomorrow.  May be from the metformin.  COVID was negative and your blood pressure looks good today.

## 2020-12-27 ENCOUNTER — Encounter (HOSPITAL_COMMUNITY): Payer: Self-pay

## 2020-12-27 ENCOUNTER — Emergency Department (HOSPITAL_COMMUNITY)
Admission: EM | Admit: 2020-12-27 | Discharge: 2020-12-27 | Disposition: A | Discharge status: Home / Self Care | Payer: BC Managed Care – PPO | Attending: Emergency Medicine | Admitting: Emergency Medicine

## 2020-12-27 DIAGNOSIS — Z87891 Personal history of nicotine dependence: Secondary | ICD-10-CM | POA: Diagnosis not present

## 2020-12-27 DIAGNOSIS — E119 Type 2 diabetes mellitus without complications: Secondary | ICD-10-CM | POA: Diagnosis not present

## 2020-12-27 DIAGNOSIS — H9201 Otalgia, right ear: Secondary | ICD-10-CM

## 2020-12-27 DIAGNOSIS — H6991 Unspecified Eustachian tube disorder, right ear: Secondary | ICD-10-CM

## 2020-12-27 DIAGNOSIS — J069 Acute upper respiratory infection, unspecified: Secondary | ICD-10-CM | POA: Diagnosis not present

## 2020-12-27 DIAGNOSIS — H6981 Other specified disorders of Eustachian tube, right ear: Secondary | ICD-10-CM

## 2020-12-27 DIAGNOSIS — Z79899 Other long term (current) drug therapy: Secondary | ICD-10-CM | POA: Insufficient documentation

## 2020-12-27 DIAGNOSIS — I10 Essential (primary) hypertension: Secondary | ICD-10-CM | POA: Insufficient documentation

## 2020-12-27 DIAGNOSIS — H748X1 Other specified disorders of right middle ear and mastoid: Secondary | ICD-10-CM | POA: Insufficient documentation

## 2020-12-27 MED ORDER — NAPROXEN 500 MG PO TABS: 500.0000 mg | ORAL_TABLET | Freq: Two times a day (BID) | ORAL | 0 refills | Status: AC

## 2020-12-27 MED ORDER — FLUTICASONE PROPIONATE 50 MCG/ACT NA SUSP: 2.0000 | Freq: Every day | NASAL | 2 refills | Status: AC

## 2020-12-27 NOTE — ED Triage Notes (Signed)
Pt. Arrived POV with complaints of nasal congestion and ear pain. Pt. States they have been having sinus issues and a sore throat for three days. Pt. States they went to use their nettie pot to help clear their sinus's so they could get some sleep. Pt. States their right ear began to hurt immediately after use and the pain hasn't eased up. Pt. States they used it 40 min. Ago.

## 2020-12-27 NOTE — ED Provider Notes (Signed)
[facility] EMERGENCY DEPARTMENT Provider Note   CSN: [id number] Arrival date & time: 12/27/20  2126     History Chief Complaint  Patient presents with   Otalgia    Kylie Robbins is a 45 y.o. female.   Otalgia Associated symptoms: rhinorrhea   Associated symptoms: no cough and no fever    This patient is a 45 year old female with a history of borderline diabetes as well as hypertension and acid reflux.  She has a history of about 3 days of nasal congestion, runny nose and a bit of a sore throat with some postnasal drip, tonight she used the Nettie pot to help clear out her nose and in so doing she felt acute onset of pain in the right ear.  She states that pain is persistent, there is no other associated symptoms including dizziness or fever.  Past Medical History:  Diagnosis Date   Allergy    Anemia    Anxiety    Depression    Diabetes mellitus without complication (HCC)    GERD (gastroesophageal reflux disease)    Hypertension     Patient Active Problem List   Diagnosis Date Noted   Vitamin D deficiency 10/17/2016   GERD without esophagitis 10/16/2016   Morbid obesity (HCC) 10/16/2016   Environmental allergies 10/16/2016   H/O food allergy 10/16/2016   Tobacco abuse 10/16/2016   Situational mixed anxiety and depressive disorder 10/16/2016    Past Surgical History:  Procedure Laterality Date   CESAREAN SECTION     CHOLECYSTECTOMY     TUBAL LIGATION       OB History     Gravida  8   Para  6   Term  6   Preterm      AB  2   Living         SAB  2   IAB      Ectopic      Multiple      Live Births              Family History  Problem Relation Age of Onset   Arthritis Mother    Hyperlipidemia Mother    Hypertension Mother    Miscarriages / [place] Mother    Lupus Mother    Arthritis Father    Diabetes Father    Hyperlipidemia Father    Hypertension Father    Glaucoma Father    Alzheimer's disease Maternal Grandmother     Heart disease Maternal Grandfather     Social History   Tobacco Use   Smoking status: Former    Types: Cigarettes    Start date: 03/10/1998    Quit date: 09/21/2017    Years since quitting: 3.2   Smokeless tobacco: Never  Vaping Use   Vaping Use: Never used  Substance Use Topics   Alcohol use: Yes    Comment: social   Drug use: No    Home Medications Prior to Admission medications   Medication Sig Start Date End Date Taking? Authorizing Provider  fluticasone (FLONASE) 50 MCG/ACT nasal spray Place 2 sprays into both nostrils daily. 12/27/20  Yes Eber Hong, MD  naproxen (NAPROSYN) 500 MG tablet Take 1 tablet (500 mg total) by mouth 2 (two) times daily with a meal. 12/27/20  Yes Eber Hong, MD  buPROPion [facility] SR) 100 MG 12 hr tablet Take 100 mg by mouth 2 (two) times daily.    [provider]  diclofenac sodium (VOLTAREN) 1 % GEL Apply 4  g topically 4 (four) times daily. As needed for pain 06/11/18   Freddy Finner, NP  escitalopram (LEXAPRO) 20 MG tablet Take 20 mg by mouth daily. 09/28/19   [provider]  famotidine (PEPCID) 20 MG tablet Take 1 tablet (20 mg total) by mouth 2 (two) times daily. 07/04/20   Bing Neighbors, FNP  hydrochlorothiazide (MICROZIDE) 12.5 MG capsule Take by mouth. 10/31/19   [provider]  hydrOXYzine (ATARAX/VISTARIL) 25 MG tablet Take 1 tablet (25 mg total) by mouth every 6 (six) hours as needed for anxiety. 06/03/19   Dione Booze, MD  levocetirizine (XYZAL) 5 MG tablet Take 1 tablet (5 mg total) by mouth every evening. 07/04/20   Bing Neighbors, FNP  losartan (COZAAR) 25 MG tablet Take by mouth. 09/26/19   [provider]  ondansetron (ZOFRAN ODT) 4 MG disintegrating tablet Take 1 tablet (4 mg total) by mouth every 6 (six) hours. 07/04/20   Bing Neighbors, FNP  phentermine 37.5 MG capsule Take 37.5 mg by mouth every morning.    [provider]  Vitamin D, Ergocalciferol, (DRISDOL) 1.25 MG  (50000 UT) CAPS capsule Take 1 capsule (50,000 Units total) by mouth every 7 (seven) days. 06/25/18   Kerri Perches, MD  SUMAtriptan (IMITREX) 50 MG tablet Take 1 tablet (50 mg total) by mouth every 2 (two) hours as needed for migraine. May repeat in 2 hours if headache persists or recurs. Do not exceed 200 mg in 24 hours. 06/30/18 12/11/19  Freddy Finner, NP    Allergies    Other  Review of Systems   Review of Systems  Constitutional:  Negative for fever.  HENT:  Positive for ear pain, postnasal drip, rhinorrhea and sinus pressure.   Eyes:  Negative for pain and redness.  Respiratory:  Negative for cough and shortness of breath.   Gastrointestinal:  Negative for nausea.   Physical Exam Updated Vital Signs BP (!) 168/100 (BP Location: Right Arm)   Pulse 80   Temp 98.4 F (36.9 C) (Oral)   Resp 20   Ht 1.676 m (5\' 6" )   Wt (!) 172.4 kg   SpO2 99%   BMI 61.33 kg/m   Physical Exam Vitals and nursing note reviewed.  Constitutional:      Appearance: She is well-developed. She is not diaphoretic.  HENT:     Head: Normocephalic and atraumatic.     Ears:     Comments: Bilateral tympanic membranes are totally clear, there is some bulging of the right tympanic membrane but no purulent effusion, no erythema, landmarks are well visualized    Nose: Nose normal. No congestion or rhinorrhea.     Mouth/Throat:     Mouth: Mucous membranes are moist.     Pharynx: No oropharyngeal exudate or posterior oropharyngeal erythema.  Eyes:     General:        Right eye: No discharge.        Left eye: No discharge.     Conjunctiva/sclera: Conjunctivae normal.  Pulmonary:     Effort: Pulmonary effort is normal. No respiratory distress.  Skin:    General: Skin is warm and dry.     Findings: No erythema or rash.  Neurological:     Mental Status: She is alert.     Coordination: Coordination normal.    ED Results / Procedures / Treatments   Labs (all labs ordered are listed, but only  abnormal results are displayed) Labs Reviewed - No data  to display  EKG None  Radiology No results found.  Procedures Procedures   Medications Ordered in ED Medications - No data to display  ED Course  I have reviewed the triage vital signs and the nursing notes.  Pertinent labs & imaging results that were available during my care of the patient were reviewed by me and considered in my medical decision making (see chart for details).    MDM Rules/Calculators/A&P                           No membrane rupture, patient appears well, suspect tympanic membranes is intact and I suspect that this is due to eustachian tube dysfunction from respiratory illness.  Will recommend nasal steroid, decongestants, time, anti-inflammatory, patient agreeable  Final Clinical Impression(s) / ED Diagnoses Final diagnoses:  Ear pain, right  Eustachian tube dysfunction, right  Upper respiratory tract infection, unspecified type    Rx / DC Orders ED Discharge Orders          Ordered    fluticasone (FLONASE) 50 MCG/ACT nasal spray  Daily        12/27/20 2242    naproxen (NAPROSYN) 500 MG tablet  2 times daily with meals        12/27/20 2242             Eber Hong, MD 12/27/20 2243

## 2020-12-27 NOTE — Discharge Instructions (Signed)
Your exam is consistent with having some increased pressure in your ear, this will get better with time, I want you to take the following medication Sudafed up to every 6 hours until your pain goes away and congestion gets better Flonase nasal spray 2 puffs in each nostril in the morning Naprosyn twice daily as needed for pain  If you develop severe or worsening symptoms return to the emergency department

## 2021-01-29 ENCOUNTER — Other Ambulatory Visit: Payer: Self-pay

## 2021-01-29 ENCOUNTER — Emergency Department (HOSPITAL_COMMUNITY)
Admission: EM | Admit: 2021-01-29 | Discharge: 2021-01-30 | Disposition: A | Discharge status: Home / Self Care | Payer: Medicaid Other | Attending: Emergency Medicine | Admitting: Emergency Medicine

## 2021-01-29 ENCOUNTER — Encounter (HOSPITAL_COMMUNITY): Payer: Self-pay | Admitting: *Deleted

## 2021-01-29 DIAGNOSIS — Z20822 Contact with and (suspected) exposure to covid-19: Secondary | ICD-10-CM | POA: Diagnosis not present

## 2021-01-29 DIAGNOSIS — Z5321 Procedure and treatment not carried out due to patient leaving prior to being seen by health care provider: Secondary | ICD-10-CM | POA: Diagnosis not present

## 2021-01-29 DIAGNOSIS — J101 Influenza due to other identified influenza virus with other respiratory manifestations: Secondary | ICD-10-CM | POA: Insufficient documentation

## 2021-01-29 DIAGNOSIS — M791 Myalgia, unspecified site: Secondary | ICD-10-CM | POA: Diagnosis present

## 2021-01-29 NOTE — ED Triage Notes (Signed)
Pt with states her son has the flu.  Pt with body aches, fever, HA and nausea since the morning. Noted with cough in triage.

## 2021-01-30 LAB — RESP PANEL BY RT-PCR (FLU A&B, COVID) ARPGX2
Influenza A by PCR: POSITIVE — AB
Influenza B by PCR: NEGATIVE
SARS Coronavirus 2 by RT PCR: NEGATIVE

## 2021-04-28 ENCOUNTER — Ambulatory Visit: Payer: Medicaid Other

## 2023-09-25 ENCOUNTER — Encounter: Payer: Self-pay | Admitting: Advanced Practice Midwife
